# Patient Record
Sex: Female | Born: 1965 | Race: Black or African American | Hispanic: No | Marital: Married | Smoking: Never smoker
Health system: Southern US, Community
[De-identification: ages and names within clinical notes are randomized; demographics above are authoritative.]

## PROBLEM LIST (undated history)

## (undated) DIAGNOSIS — E119 Type 2 diabetes mellitus without complications: Secondary | ICD-10-CM

## (undated) DIAGNOSIS — F419 Anxiety disorder, unspecified: Secondary | ICD-10-CM

## (undated) DIAGNOSIS — K219 Gastro-esophageal reflux disease without esophagitis: Secondary | ICD-10-CM

## (undated) DIAGNOSIS — I10 Essential (primary) hypertension: Secondary | ICD-10-CM

## (undated) DIAGNOSIS — E785 Hyperlipidemia, unspecified: Secondary | ICD-10-CM

## (undated) HISTORY — DX: Gastro-esophageal reflux disease without esophagitis: K21.9

## (undated) HISTORY — DX: Anxiety disorder, unspecified: F41.9

## (undated) HISTORY — DX: Hyperlipidemia, unspecified: E78.5

---

## 2002-01-20 ENCOUNTER — Inpatient Hospital Stay (HOSPITAL_COMMUNITY): Admission: AD | Admit: 2002-01-20 | Discharge: 2002-01-20 | Payer: Self-pay | Admitting: Obstetrics and Gynecology

## 2003-10-05 ENCOUNTER — Emergency Department (HOSPITAL_COMMUNITY): Admission: EM | Admit: 2003-10-05 | Discharge: 2003-10-05 | Payer: Self-pay | Admitting: Emergency Medicine

## 2004-02-13 ENCOUNTER — Other Ambulatory Visit: Admission: RE | Admit: 2004-02-13 | Discharge: 2004-02-13 | Payer: Self-pay | Admitting: *Deleted

## 2005-04-11 ENCOUNTER — Emergency Department (HOSPITAL_COMMUNITY): Admission: EM | Admit: 2005-04-11 | Discharge: 2005-04-11 | Payer: Self-pay | Admitting: Emergency Medicine

## 2006-09-07 ENCOUNTER — Emergency Department (HOSPITAL_COMMUNITY): Admission: EM | Admit: 2006-09-07 | Discharge: 2006-09-07 | Payer: Self-pay | Admitting: Emergency Medicine

## 2007-02-03 ENCOUNTER — Other Ambulatory Visit: Admission: RE | Admit: 2007-02-03 | Discharge: 2007-02-03 | Payer: Self-pay | Admitting: Family Medicine

## 2007-06-23 ENCOUNTER — Encounter (INDEPENDENT_AMBULATORY_CARE_PROVIDER_SITE_OTHER): Payer: Self-pay | Admitting: Obstetrics and Gynecology

## 2007-06-23 ENCOUNTER — Ambulatory Visit (HOSPITAL_COMMUNITY): Admission: RE | Admit: 2007-06-23 | Discharge: 2007-06-23 | Payer: Self-pay | Admitting: Obstetrics and Gynecology

## 2007-12-21 IMAGING — CT CT PELVIS W/ CM
2 of 5 series · 17 of 46 positions shown, 19 images · IV contrast ([ID]/WATER & 100 ML OMNI 300)
Comparison: 10/05/03.

CLINICAL DATA: 40-year-old with left-sided abdominal pain.
 ABDOMEN CT WITH CONTRAST:
TECHNIQUE: Multidetector CT imaging of the abdomen was performed following the standard protocol during bolus administration of intravenous contrast.
 Contrast:  100 cc Omnipaque 300.
TECHNIQUE: Multidetector CT imaging of the pelvis was performed following the standard protocol during bolus administration of intravenous contrast.

[Series 2: routine abdomen · axial · 0.94mm/px · z∈[-443,-33]mm · 14 of 92 slices shown, 16 images]
[im 5/92  soft-tissue]
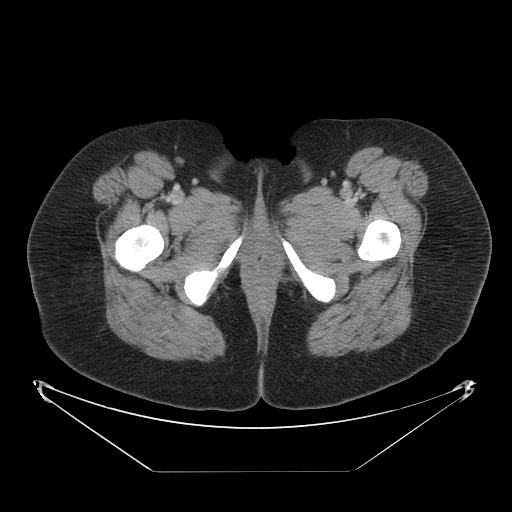
[im 5/92  bone]
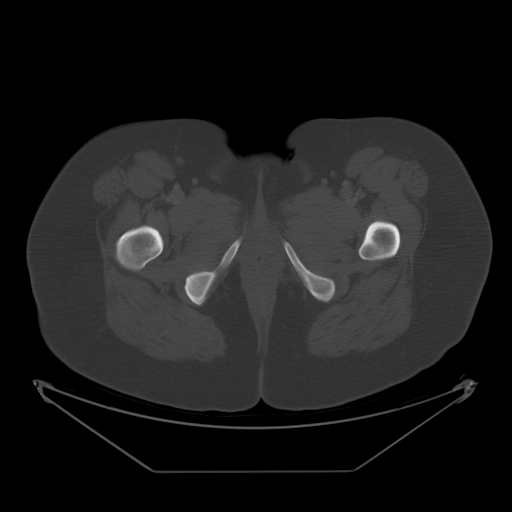
[im 10/92  soft-tissue]
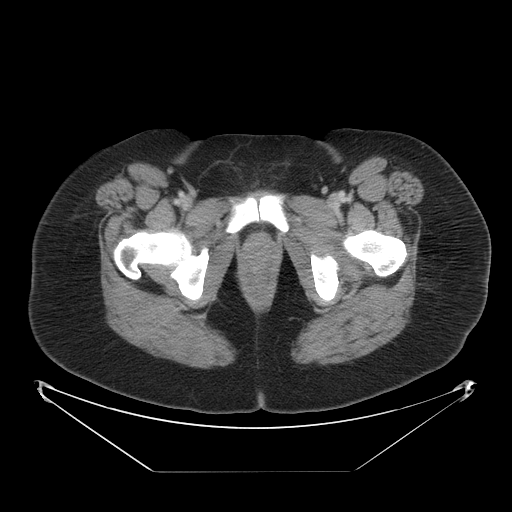
[im 20/92  soft-tissue]
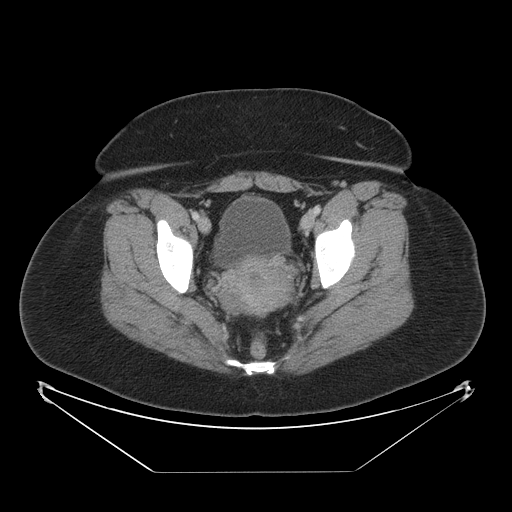
[im 24/92  soft-tissue]
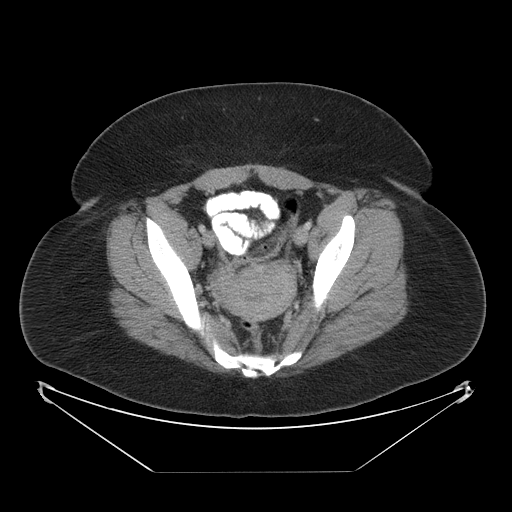
[im 29/92  soft-tissue]
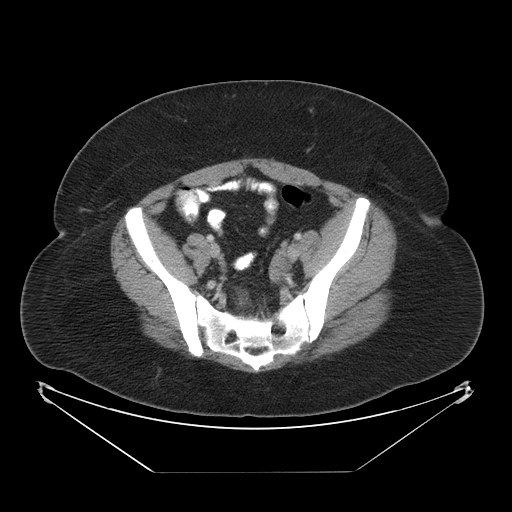
[im 39/92  soft-tissue]
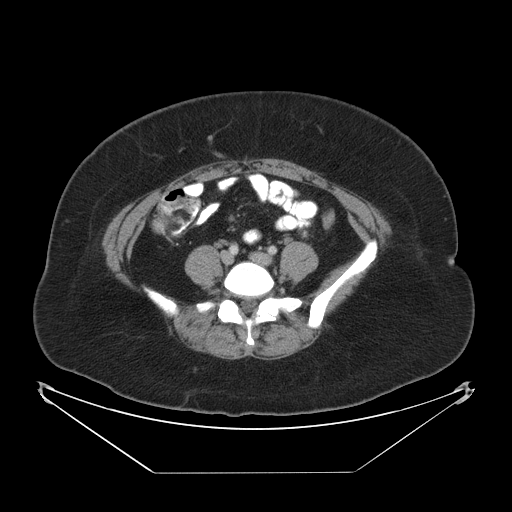
[im 44/92  soft-tissue]
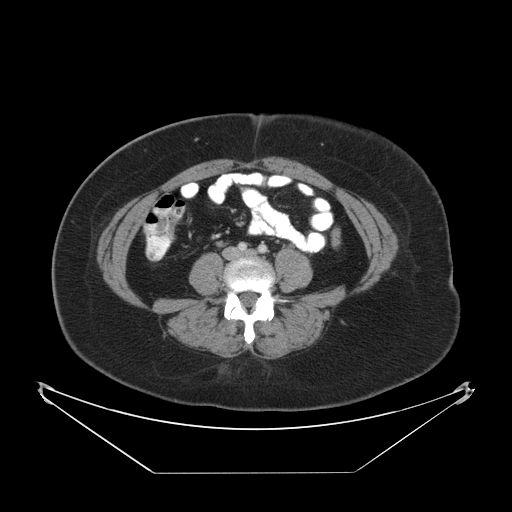
[im 48/92  soft-tissue]
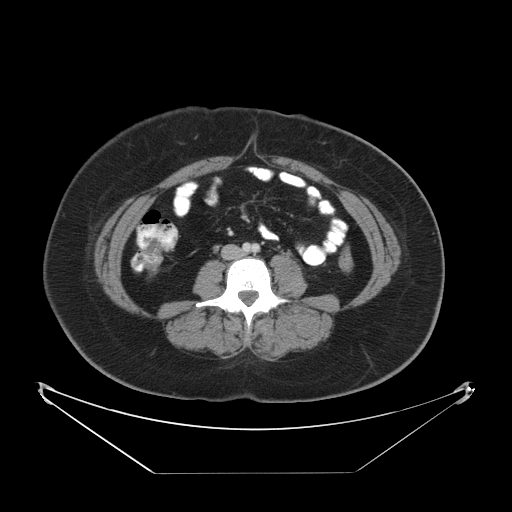
[im 53/92  soft-tissue]
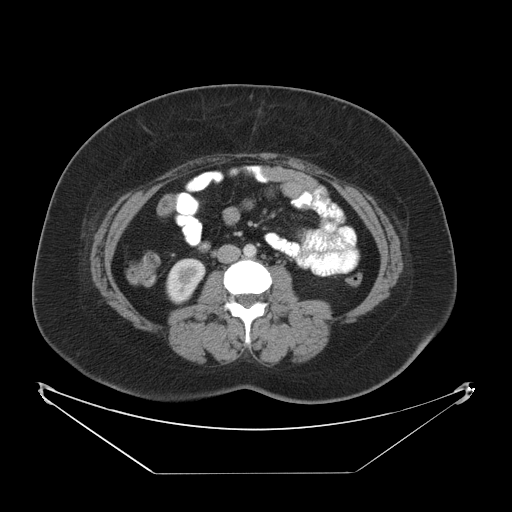
[im 53/92  bone]
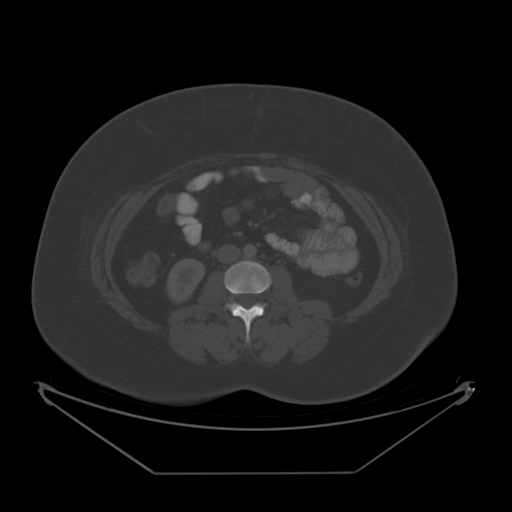
[im 63/92  soft-tissue]
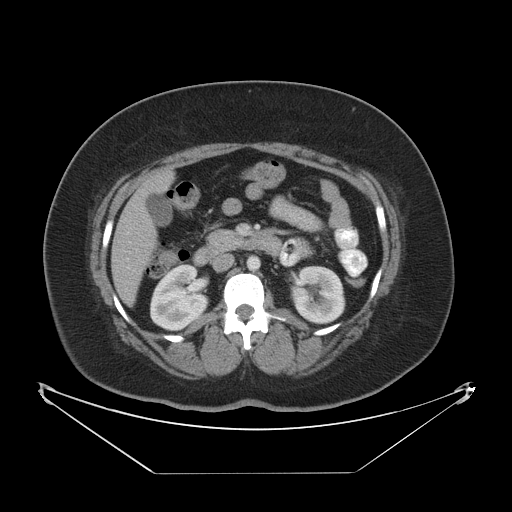
[im 68/92  soft-tissue]
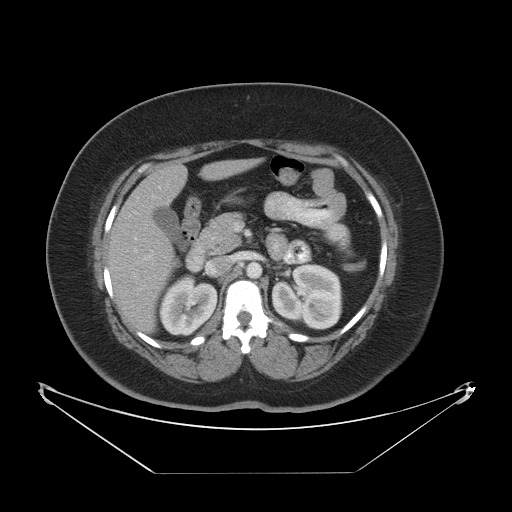
[im 72/92  soft-tissue]
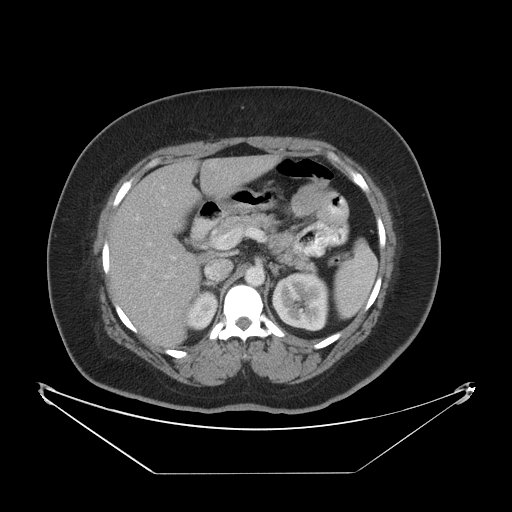
[im 82/92  soft-tissue]
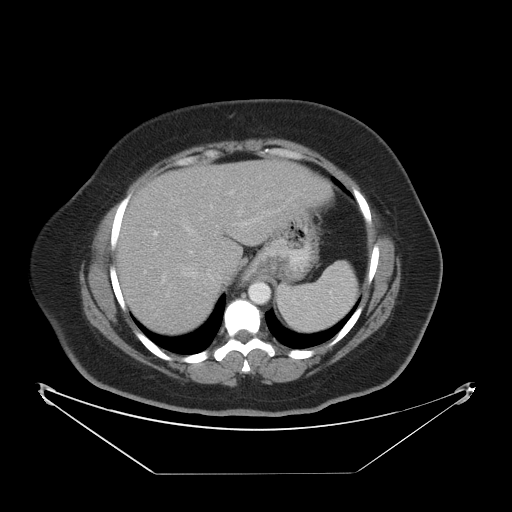
[im 87/92  soft-tissue]
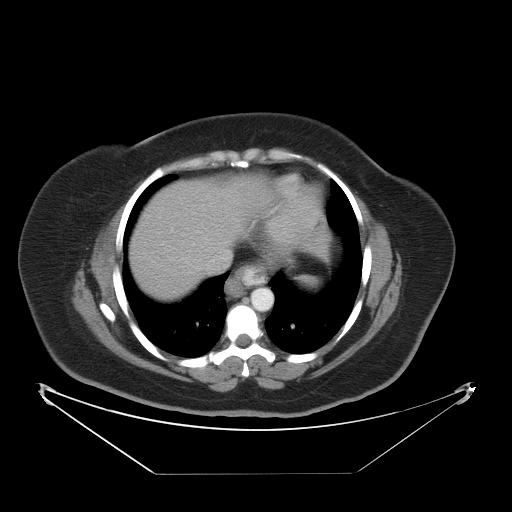

[Series 400: coronals · coronal · 0.94mm/px · 3 of 77 slices shown]
[im 26/77  soft-tissue]
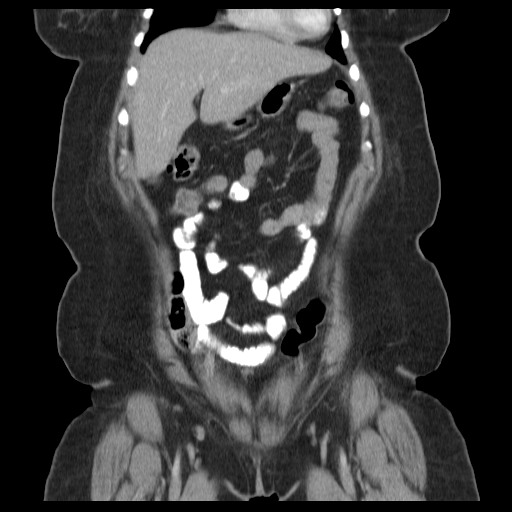
[im 34/77  soft-tissue]
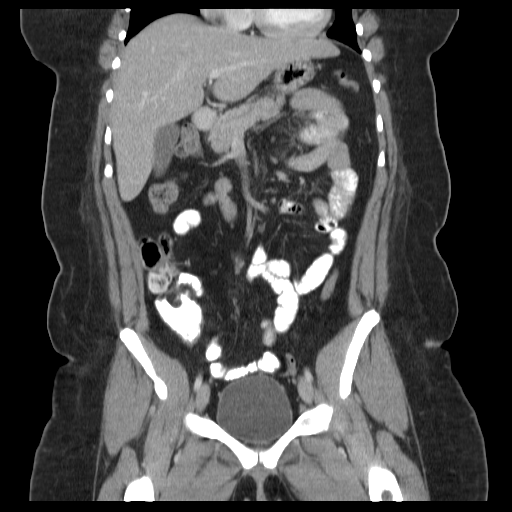
[im 43/77  soft-tissue]
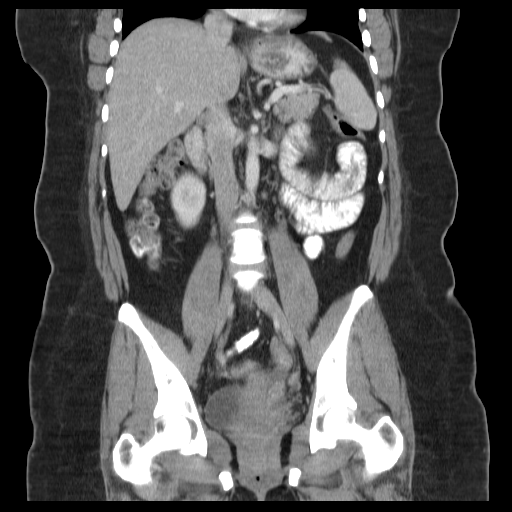

[17 of 46 positions shown; findings below may reference images not displayed]

FINDINGS: The lung bases are clear.  The patient has a moderate sized hiatal hernia.  Heart size is normal.  The distal thoracic aorta is normal in caliber.   
 The liver, spleen, pancreas, adrenal glands and kidneys are unremarkable and stable in appearance.  
 The stomach, duodenum, small bowel and colon demonstrate no significant abnormalities.  The aorta is normal in caliber.  No dissection.  Branch vessels are normal.  Portal and splenic veins are patent.  Gallbladder appears normal.  No significant bony findings.
IMPRESSION: 1. Moderate sized hiatal hernia.  
 2. No acute abdominal findings, mass lesions or adenopathy.  
 PELVIS CT WITH CONTRAST:
FINDINGS: The uterus and ovaries appear normal.  Rectum, sigmoid colon and visualized small bowel loops are unremarkable.  No pelvic masses or adenopathy.  Major vascular structures appear normal.  No significant bony findings.
IMPRESSION: No acute pelvic findings.

## 2008-11-04 ENCOUNTER — Other Ambulatory Visit: Admission: RE | Admit: 2008-11-04 | Discharge: 2008-11-04 | Payer: Self-pay | Admitting: Family Medicine

## 2008-11-08 ENCOUNTER — Ambulatory Visit (HOSPITAL_COMMUNITY): Admission: RE | Admit: 2008-11-08 | Discharge: 2008-11-08 | Payer: Self-pay | Admitting: Family Medicine

## 2008-11-11 ENCOUNTER — Encounter: Admission: RE | Admit: 2008-11-11 | Discharge: 2008-11-11 | Payer: Self-pay | Admitting: Family Medicine

## 2010-10-21 ENCOUNTER — Encounter: Payer: Self-pay | Admitting: Family Medicine

## 2011-02-12 NOTE — Op Note (Signed)
NAME:  Krystal Mcclain, Krystal Mcclain NO.:  [id number]   MEDICAL RECORD NO.:  [id number]          PATIENT TYPE:  AMB   LOCATION:  SDC                           FACILITY:  [facility]   PHYSICIAN:  Charles A. Delcambre, MDDATE OF BIRTH:  August 15, 1966   DATE OF PROCEDURE:  06/23/2007  DATE OF DISCHARGE:                               OPERATIVE REPORT   PREOPERATIVE DIAGNOSES:  1. Menorrhagia.  2. Possible endometrial polyps.   POSTOPERATIVE DIAGNOSES:  1. Menorrhagia.  2. Possible endometrial polyps.   PROCEDURE:  1. Hysteroscopy.  2. Dilatation and curettage.  3. ThermaChoice endometrial ablation.  4. Paracervical block.   SURGEON:  Charles A. Sydnee Cabal, M.D.   ASSISTANT:  None.   COMPLICATIONS:  None.   ESTIMATED BLOOD LOSS:  Less than 10 cc.   SPECIMENS:  Endometrial polyps, endometrial curettings to pathology.   OPERATIVE FINDINGS:  Multiple endometrial polyps, approximately 1 cm,  sounded to 10 cm.   SPONGE, NEEDLE, INSTRUMENT COUNTS:  Correct x2.   Sorbitol wash 30 cc.   DESCRIPTION OF PROCEDURE:  Patient was taken to the operating room,  placed in a supine position.  A general anesthetic was induced without  difficulty.  She was then placed in the dorsal lithotomy position in the  universal stirrups.  Sterilely prepped and draped was undertaken.  A  weighted speculum was placed in the vagina.  The tenaculum was placed on  the anterior lip of the cervix.  Sound was passed to 10 cm.  There was  no obvious perforation.  A very slight dilation with Hanks dilators was  used, 3 mm.  A hysteroscope was then passed with findings as noted  above.  With polyp forceps, multiple polyps were removed.  A small banjo  curette would fit through the cervix at that point, and circumferential  curettage was undertaken.  There was no evidence of perforation.  A  moderate amount of tissue was obtained.  Attention was then given to the  ThermaChoice.  The ThermaChoice was prepped per  protocol and brought to  -2 180 mmHg and then inserted to the fundus and then withdrawn  approximately 1 cm.  Pressure was stabilized with injecting water to D5W  to 160 to 180 mmHg.  Once stabilization occurring, the heating cycle was  begun.  The heating cycle took just over a minute, then the therapeutic  cycle at 87 degree Centigrade was accomplished without difficulty,  incident, or complications, for 8 minutes.  Cool-down was allowed, and  fluid was removed from the balloon.  A total of 20 cc had been injected  to distend the balloon in the cavity.  There was no evidence of  perforation during the case.  The tenaculum was removed.  Hemostasis was  adequate.  Patient tolerated the procedure well and was taken to the  recovery room with the assistant in attendance.      Charles A. Sydnee Cabal, MD  Electronically Signed     CAD/MEDQ  D:  06/23/2007  T:  06/23/2007  Job:  [id number]

## 2011-07-10 ENCOUNTER — Other Ambulatory Visit: Payer: Self-pay | Admitting: Physician Assistant

## 2011-07-10 ENCOUNTER — Other Ambulatory Visit (HOSPITAL_COMMUNITY)
Admission: RE | Admit: 2011-07-10 | Discharge: 2011-07-10 | Disposition: A | Discharge status: Home / Self Care | Payer: BC Managed Care – PPO | Source: Ambulatory Visit | Attending: Family Medicine | Admitting: Family Medicine

## 2011-07-10 DIAGNOSIS — Z124 Encounter for screening for malignant neoplasm of cervix: Secondary | ICD-10-CM | POA: Insufficient documentation

## 2011-07-11 LAB — CBC
HCT: 33.5 — ABNORMAL LOW
Hemoglobin: 10.8 — ABNORMAL LOW
MCHC: 32.3
MCV: 65.9 — ABNORMAL LOW
Platelets: 314
RBC: 5.09
RDW: 25.2 — ABNORMAL HIGH
WBC: 7.7

## 2011-07-11 LAB — PREGNANCY, URINE: Preg Test, Ur: NEGATIVE

## 2011-07-24 ENCOUNTER — Other Ambulatory Visit (HOSPITAL_COMMUNITY): Payer: Self-pay | Admitting: Physician Assistant

## 2011-07-24 DIAGNOSIS — Z1231 Encounter for screening mammogram for malignant neoplasm of breast: Secondary | ICD-10-CM

## 2011-08-16 ENCOUNTER — Ambulatory Visit (HOSPITAL_COMMUNITY): Payer: BC Managed Care – PPO | Attending: Physician Assistant

## 2013-07-09 ENCOUNTER — Ambulatory Visit
Admission: RE | Admit: 2013-07-09 | Discharge: 2013-07-09 | Disposition: A | Discharge status: Home / Self Care | Payer: 59 | Source: Ambulatory Visit | Attending: Physician Assistant | Admitting: Physician Assistant

## 2013-07-09 ENCOUNTER — Other Ambulatory Visit: Payer: Self-pay | Admitting: Physician Assistant

## 2013-07-09 DIAGNOSIS — M79672 Pain in left foot: Secondary | ICD-10-CM

## 2013-09-27 ENCOUNTER — Emergency Department (HOSPITAL_COMMUNITY): Payer: Managed Care, Other (non HMO)

## 2013-09-27 ENCOUNTER — Encounter (HOSPITAL_COMMUNITY): Payer: Self-pay | Admitting: Emergency Medicine

## 2013-09-27 ENCOUNTER — Emergency Department (HOSPITAL_COMMUNITY)
Admission: EM | Admit: 2013-09-27 | Discharge: 2013-09-27 | Disposition: A | Discharge status: Home / Self Care | Payer: Managed Care, Other (non HMO) | Attending: Emergency Medicine | Admitting: Emergency Medicine

## 2013-09-27 DIAGNOSIS — I10 Essential (primary) hypertension: Secondary | ICD-10-CM | POA: Insufficient documentation

## 2013-09-27 DIAGNOSIS — Z791 Long term (current) use of non-steroidal anti-inflammatories (NSAID): Secondary | ICD-10-CM | POA: Insufficient documentation

## 2013-09-27 DIAGNOSIS — S335XXA Sprain of ligaments of lumbar spine, initial encounter: Secondary | ICD-10-CM | POA: Insufficient documentation

## 2013-09-27 DIAGNOSIS — Y92410 Unspecified street and highway as the place of occurrence of the external cause: Secondary | ICD-10-CM | POA: Insufficient documentation

## 2013-09-27 DIAGNOSIS — E119 Type 2 diabetes mellitus without complications: Secondary | ICD-10-CM | POA: Insufficient documentation

## 2013-09-27 DIAGNOSIS — S39012A Strain of muscle, fascia and tendon of lower back, initial encounter: Secondary | ICD-10-CM

## 2013-09-27 DIAGNOSIS — R42 Dizziness and giddiness: Secondary | ICD-10-CM | POA: Insufficient documentation

## 2013-09-27 DIAGNOSIS — Z79899 Other long term (current) drug therapy: Secondary | ICD-10-CM | POA: Insufficient documentation

## 2013-09-27 DIAGNOSIS — Y9389 Activity, other specified: Secondary | ICD-10-CM | POA: Insufficient documentation

## 2013-09-27 HISTORY — DX: Essential (primary) hypertension: I10

## 2013-09-27 HISTORY — DX: Type 2 diabetes mellitus without complications: E11.9

## 2013-09-27 MED ORDER — NAPROXEN 375 MG PO TABS: 375.0000 mg | ORAL_TABLET | Freq: Two times a day (BID) | ORAL | Status: DC

## 2013-09-27 MED ORDER — KETOROLAC TROMETHAMINE 60 MG/2ML IM SOLN
60.0000 mg | Freq: Once | INTRAMUSCULAR | Status: AC
  Administered 2013-09-27: 60 mg via INTRAMUSCULAR
  Filled 2013-09-27: qty 2

## 2013-09-27 MED ORDER — METHOCARBAMOL 500 MG PO TABS: 500.0000 mg | ORAL_TABLET | Freq: Two times a day (BID) | ORAL | Status: DC
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
## 2013-09-29 NOTE — ED Provider Notes (Signed)
Medical screening examination/treatment/procedure(s) were performed by non-physician practitioner and as supervising physician I was immediately available for consultation/collaboration.  EKG Interpretation   None        Doug Sou, MD 09/29/13 2036

## 2014-10-06 ENCOUNTER — Ambulatory Visit
Admission: RE | Admit: 2014-10-06 | Discharge: 2014-10-06 | Disposition: A | Discharge status: Home / Self Care | Payer: Managed Care, Other (non HMO) | Source: Ambulatory Visit | Attending: Physician Assistant | Admitting: Physician Assistant

## 2014-10-06 ENCOUNTER — Other Ambulatory Visit: Payer: Self-pay | Admitting: Physician Assistant

## 2014-10-06 DIAGNOSIS — R52 Pain, unspecified: Secondary | ICD-10-CM

## 2014-10-22 IMAGING — CR DG FOOT COMPLETE 3+V*L*
3 series · 3 of 3 positions shown · non-contrast
Comparison: Ankle films same date

CLINICAL DATA: Pain without trauma.

EXAM:
LEFT FOOT - COMPLETE 3+ VIEW

[t foot ap left]
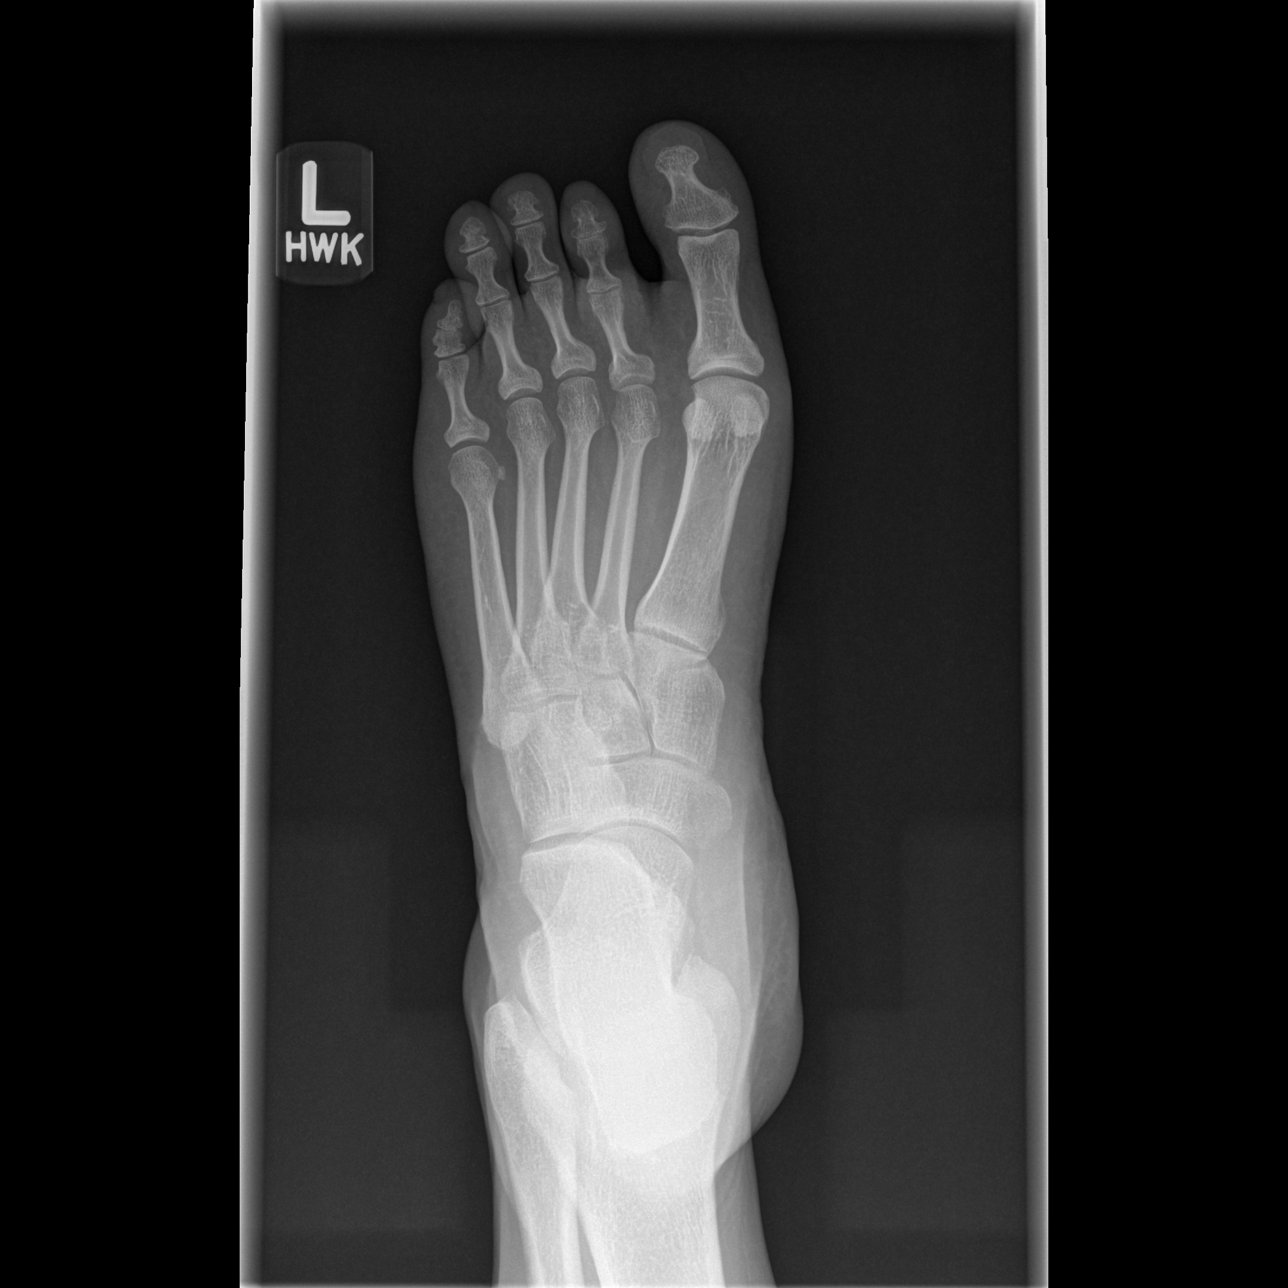

[t foot oblique left]
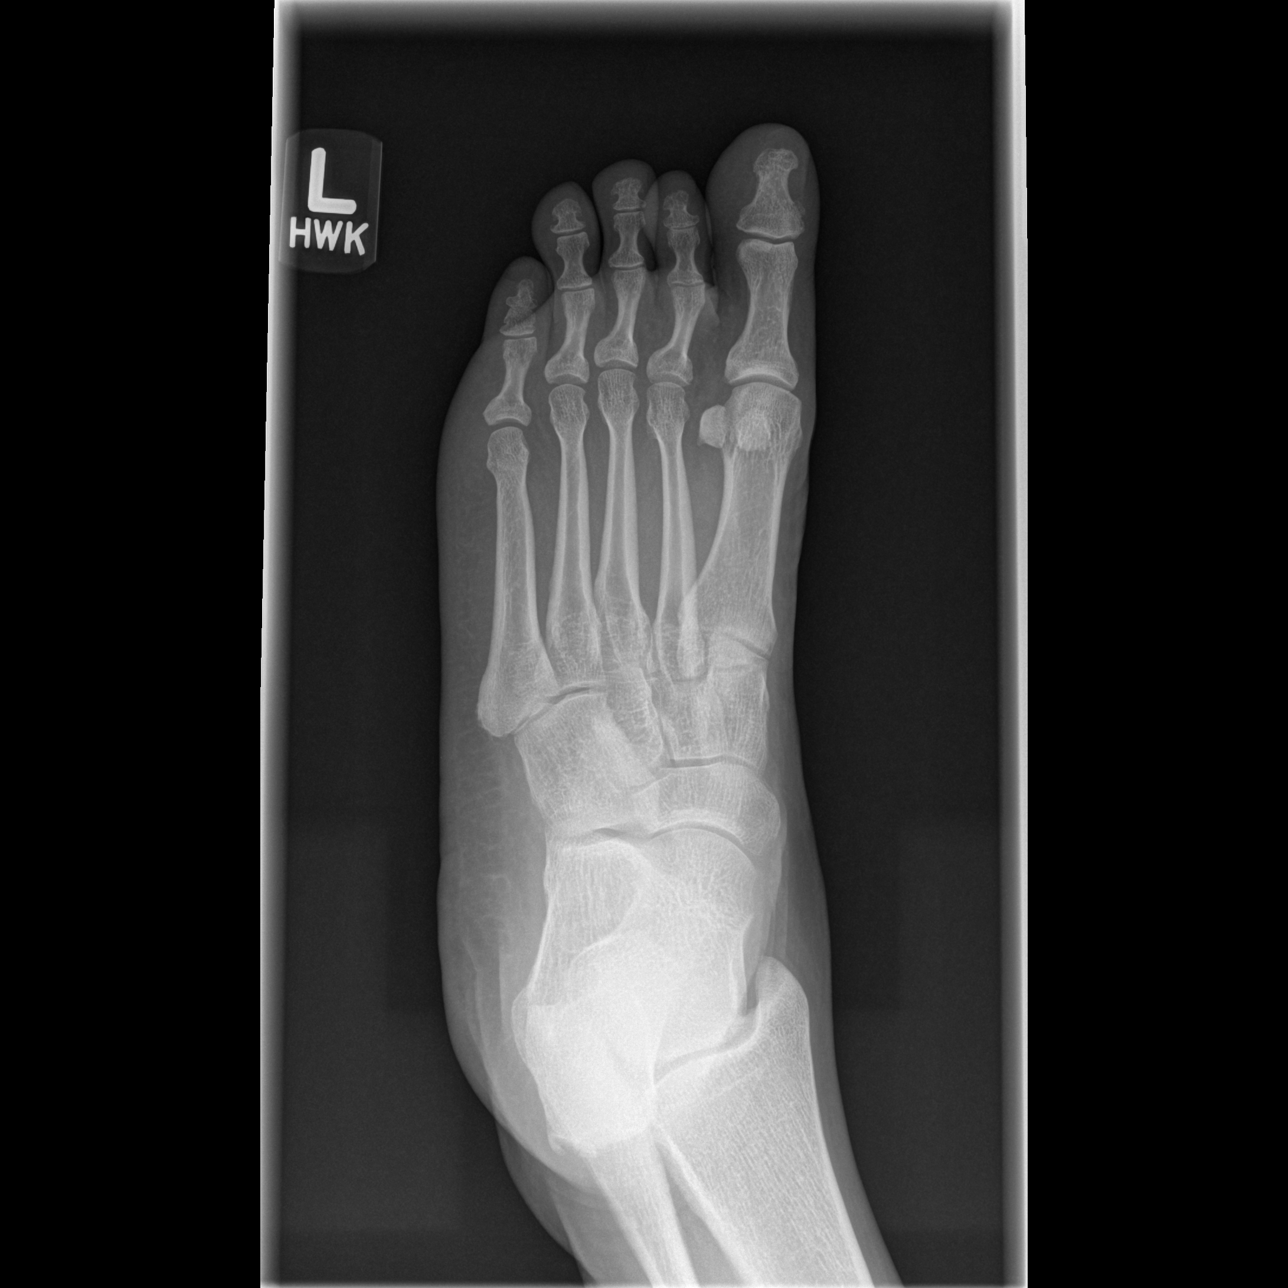

[t foot lat left]
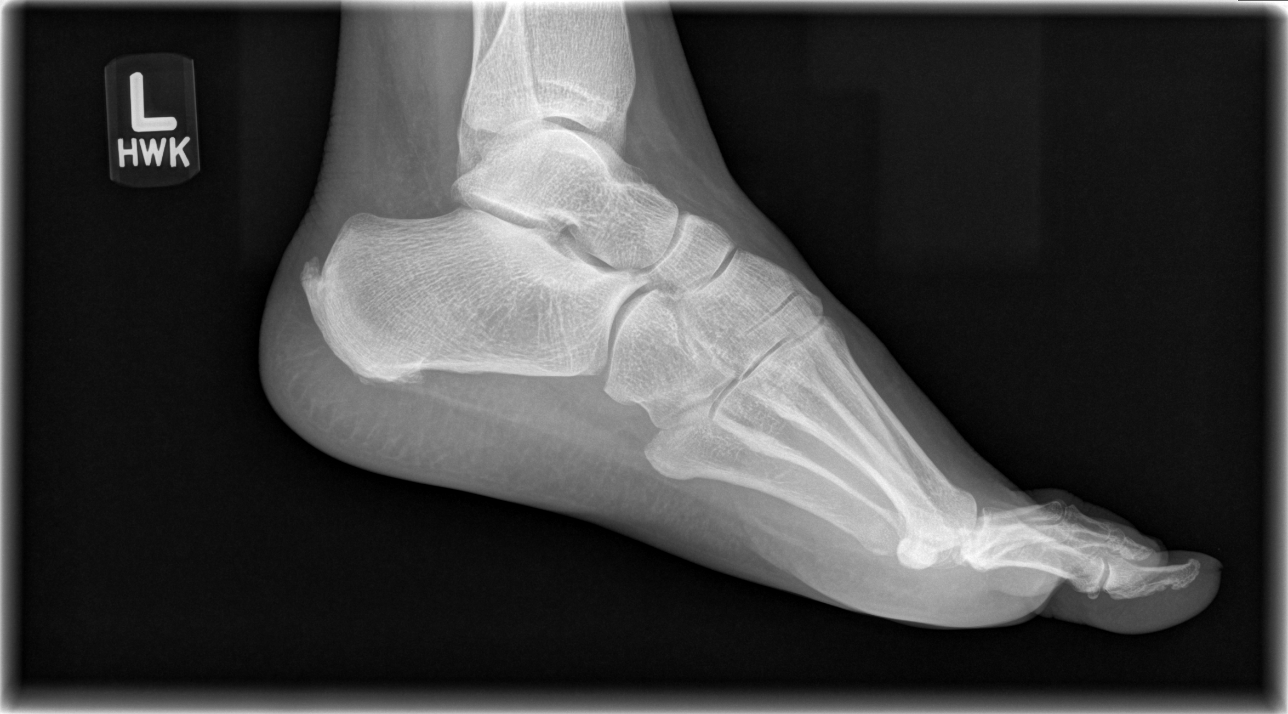

[3 of 3 positions shown; findings below may reference images not displayed]

FINDINGS: Relatively foreshortened 2nd digit, likely developmental variant.
Small Achilles and calcaneal spurs. No acute fracture or
dislocation. No evidence of stress fracture.
IMPRESSION: No acute osseous abnormality.

## 2015-12-11 ENCOUNTER — Other Ambulatory Visit: Payer: Self-pay | Admitting: Physician Assistant

## 2015-12-11 DIAGNOSIS — Z1231 Encounter for screening mammogram for malignant neoplasm of breast: Secondary | ICD-10-CM

## 2015-12-28 ENCOUNTER — Ambulatory Visit
Admission: RE | Admit: 2015-12-28 | Discharge: 2015-12-28 | Disposition: A | Discharge status: Home / Self Care | Payer: Managed Care, Other (non HMO) | Source: Ambulatory Visit | Attending: Physician Assistant | Admitting: Physician Assistant

## 2015-12-28 DIAGNOSIS — Z1231 Encounter for screening mammogram for malignant neoplasm of breast: Secondary | ICD-10-CM

## 2016-01-02 ENCOUNTER — Other Ambulatory Visit: Payer: Self-pay | Admitting: Physician Assistant

## 2016-01-02 DIAGNOSIS — R928 Other abnormal and inconclusive findings on diagnostic imaging of breast: Secondary | ICD-10-CM

## 2016-01-09 ENCOUNTER — Ambulatory Visit
Admission: RE | Admit: 2016-01-09 | Discharge: 2016-01-09 | Disposition: A | Discharge status: Home / Self Care | Payer: Managed Care, Other (non HMO) | Source: Ambulatory Visit | Attending: Physician Assistant | Admitting: Physician Assistant

## 2016-01-09 DIAGNOSIS — R928 Other abnormal and inconclusive findings on diagnostic imaging of breast: Secondary | ICD-10-CM

## 2016-01-22 ENCOUNTER — Other Ambulatory Visit: Payer: Self-pay | Admitting: Physician Assistant

## 2016-01-22 DIAGNOSIS — R1310 Dysphagia, unspecified: Secondary | ICD-10-CM

## 2016-01-30 ENCOUNTER — Ambulatory Visit
Admission: RE | Admit: 2016-01-30 | Discharge: 2016-01-30 | Disposition: A | Discharge status: Home / Self Care | Payer: Managed Care, Other (non HMO) | Source: Ambulatory Visit | Attending: Physician Assistant | Admitting: Physician Assistant

## 2016-01-30 DIAGNOSIS — R1310 Dysphagia, unspecified: Secondary | ICD-10-CM

## 2016-08-06 ENCOUNTER — Other Ambulatory Visit: Payer: Self-pay | Admitting: Physician Assistant

## 2016-08-06 ENCOUNTER — Other Ambulatory Visit (HOSPITAL_COMMUNITY)
Admission: RE | Admit: 2016-08-06 | Discharge: 2016-08-06 | Disposition: A | Discharge status: Home / Self Care | Payer: Managed Care, Other (non HMO) | Source: Ambulatory Visit | Attending: Family Medicine | Admitting: Family Medicine

## 2016-08-06 DIAGNOSIS — Z124 Encounter for screening for malignant neoplasm of cervix: Secondary | ICD-10-CM | POA: Insufficient documentation

## 2016-08-08 LAB — CYTOLOGY - PAP: Diagnosis: NEGATIVE

## 2018-03-02 ENCOUNTER — Ambulatory Visit: Payer: Managed Care, Other (non HMO) | Admitting: Registered"

## 2018-10-14 ENCOUNTER — Other Ambulatory Visit: Payer: Self-pay | Admitting: Physician Assistant

## 2018-10-14 ENCOUNTER — Other Ambulatory Visit (HOSPITAL_COMMUNITY)
Admission: RE | Admit: 2018-10-14 | Discharge: 2018-10-14 | Disposition: A | Discharge status: Home / Self Care | Payer: 59 | Source: Ambulatory Visit | Attending: Physician Assistant | Admitting: Physician Assistant

## 2018-10-14 DIAGNOSIS — Z01411 Encounter for gynecological examination (general) (routine) with abnormal findings: Secondary | ICD-10-CM | POA: Diagnosis present

## 2018-10-19 LAB — CYTOLOGY - PAP: Diagnosis: NEGATIVE

## 2018-10-21 ENCOUNTER — Other Ambulatory Visit: Payer: Self-pay | Admitting: Physician Assistant

## 2018-10-21 DIAGNOSIS — N6489 Other specified disorders of breast: Secondary | ICD-10-CM

## 2018-10-23 ENCOUNTER — Ambulatory Visit
Admission: RE | Admit: 2018-10-23 | Discharge: 2018-10-23 | Disposition: A | Discharge status: Home / Self Care | Payer: 59 | Source: Ambulatory Visit | Attending: Physician Assistant | Admitting: Physician Assistant

## 2018-10-23 ENCOUNTER — Ambulatory Visit: Admission: RE | Admit: 2018-10-23 | Payer: Managed Care, Other (non HMO) | Source: Ambulatory Visit

## 2018-10-23 DIAGNOSIS — N6489 Other specified disorders of breast: Secondary | ICD-10-CM

## 2019-11-26 ENCOUNTER — Other Ambulatory Visit: Payer: Self-pay | Admitting: Physician Assistant

## 2019-11-26 DIAGNOSIS — Z1231 Encounter for screening mammogram for malignant neoplasm of breast: Secondary | ICD-10-CM

## 2020-01-07 ENCOUNTER — Ambulatory Visit: Payer: 59

## 2020-01-14 ENCOUNTER — Ambulatory Visit
Admission: RE | Admit: 2020-01-14 | Discharge: 2020-01-14 | Disposition: A | Discharge status: Home / Self Care | Payer: 59 | Source: Ambulatory Visit | Attending: Physician Assistant | Admitting: Physician Assistant

## 2020-01-14 ENCOUNTER — Other Ambulatory Visit: Payer: Self-pay

## 2020-01-14 DIAGNOSIS — Z1231 Encounter for screening mammogram for malignant neoplasm of breast: Secondary | ICD-10-CM

## 2020-01-17 ENCOUNTER — Other Ambulatory Visit: Payer: Self-pay | Admitting: Physician Assistant

## 2020-01-17 DIAGNOSIS — R928 Other abnormal and inconclusive findings on diagnostic imaging of breast: Secondary | ICD-10-CM

## 2020-01-28 ENCOUNTER — Other Ambulatory Visit: Payer: Self-pay

## 2020-01-28 ENCOUNTER — Ambulatory Visit
Admission: RE | Admit: 2020-01-28 | Discharge: 2020-01-28 | Disposition: A | Discharge status: Home / Self Care | Payer: 59 | Source: Ambulatory Visit | Attending: Physician Assistant | Admitting: Physician Assistant

## 2020-01-28 ENCOUNTER — Ambulatory Visit: Payer: 59

## 2020-01-28 DIAGNOSIS — R928 Other abnormal and inconclusive findings on diagnostic imaging of breast: Secondary | ICD-10-CM

## 2021-01-26 ENCOUNTER — Other Ambulatory Visit: Payer: Self-pay

## 2021-01-26 ENCOUNTER — Encounter: Payer: Self-pay | Admitting: Cardiology

## 2021-01-26 ENCOUNTER — Ambulatory Visit: Payer: 59 | Admitting: Cardiology

## 2021-01-26 VITALS — BP 148/89 | HR 78 | Temp 97.4°F | Resp 16 | Ht 64.0 in | Wt 236.1 lb

## 2021-01-26 DIAGNOSIS — E66813 Obesity, class 3: Secondary | ICD-10-CM

## 2021-01-26 DIAGNOSIS — Z794 Long term (current) use of insulin: Secondary | ICD-10-CM

## 2021-01-26 DIAGNOSIS — R0789 Other chest pain: Secondary | ICD-10-CM

## 2021-01-26 DIAGNOSIS — I10 Essential (primary) hypertension: Secondary | ICD-10-CM

## 2021-01-26 DIAGNOSIS — E119 Type 2 diabetes mellitus without complications: Secondary | ICD-10-CM

## 2021-01-26 DIAGNOSIS — E7800 Pure hypercholesterolemia, unspecified: Secondary | ICD-10-CM

## 2021-01-26 MED ORDER — VALSARTAN-HYDROCHLOROTHIAZIDE 160-25 MG PO TABS: 1.0000 | ORAL_TABLET | ORAL | 1 refills | Status: DC

## 2021-01-26 MED ORDER — METOPROLOL SUCCINATE ER 25 MG PO TB24: 25.0000 mg | ORAL_TABLET | Freq: Every evening | ORAL | 2 refills | Status: DC

## 2021-01-26 NOTE — Patient Instructions (Signed)
To be scheduled for a treadmill stress test in office soon.  Will also be scheduled for an echocardiogram which is the ultrasound for your heart to evaluate your heart function.  You need to get blood work done in 2 weeks at [facility], it does not have to be fasting.  You will take a total of 160 mg of valsartan, you have 40 mg tablets, hence take 4 tablets every day and also take hydrochlorothiazide 25 mg with this in the morning.  Once you are finished with valsartan tablets that are at home, discard both hydrochlorothiazide and the remaining valsartan and pick up a new prescription for new dose that is 160 mg / 25 mg of valsartan HCT.    I will see you back after the test.

## 2021-01-26 NOTE — Progress Notes (Signed)
Primary Physician/Referring:  Milus Height, PA  Patient ID: Krystal Mcclain, female    DOB: 1966/06/22, 55 y.o.   MRN: [id number]  Chief Complaint  Patient presents with  . Chest Pain  . New Patient (Initial Visit)    Referred by Ardean Larsen, MD   HPI:    Krystal Mcclain  is a 55 y.o. African-American female patient with hypertension, hyperlipidemia, diabetes mellitus, sickle cell trait, referred to me for evaluation of chest pain.  She is markedly sedentary, has been doing well until about 3 weeks ago had sudden onset of chest tightness while at work that was on and off that lasted for 3 days.  Since then she has not had any recurrence.  She has been taking it easy since then.  No other associated symptoms of nausea, vomiting or diaphoresis.  Denies any dyspnea but states that her activity is markedly limited.  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  After she was seen by her PCP, she was restarted back on atorvastatin which she had discontinued 4 to 6 months ago.  She is tolerating all her medications well.  Past Medical History:  Diagnosis Date  . Anxiety   . Diabetes mellitus without complication (HCC)   . Hypertension   . Mild acid reflux    Past Surgical History:  Procedure Laterality Date  . CESAREAN SECTION  11/1990   Family History  Problem Relation Age of Onset  . Heart attack Mother 60  . Other Father 35       Drowned  . Other Brother 82       Pacemaker    Social History   Tobacco Use  . Smoking status: Never Smoker  . Smokeless tobacco: Never Used  Substance Use Topics  . Alcohol use: Never   Marital Status: Married  ROS  Review of Systems  Cardiovascular: Positive for chest pain. Negative for dyspnea on exertion and leg swelling.  Gastrointestinal: Negative for melena.   Objective  Blood pressure (!) 148/89, pulse 78, temperature (!) 97.4 F (36.3 C), temperature source Temporal, resp. rate 16, height 5\' 4"  (1.626 m), weight 236 lb 1.6 oz  (107.1 kg), SpO2 98 %.   Vitals with BMI 01/26/2021 01/26/2021 09/27/2013  Height - 5\' 4"  5\' 4"   Weight - 236 lbs 2 oz 240 lbs  BMI - 40.51 41.3  Systolic 148 176 09/29/2013  Diastolic 89 87 87  Pulse 78 87 83     Physical Exam  Constitutional: No distress.  Morbidly obese in no acute distress  Eyes: Conjunctivae are normal.  Neck: No JVD present.  Cardiovascular: Normal rate, regular rhythm, normal heart sounds, intact distal pulses and normal pulses. Exam reveals no gallop.  No murmur heard. Pulmonary/Chest: Effort normal and breath sounds normal. She exhibits no tenderness.  Abdominal: Soft. Bowel sounds are normal.  Large pannus present  Musculoskeletal:        General: No edema. Normal range of motion.     Cervical back: Neck supple.  Neurological: She is alert and oriented to person, place, and time.  Skin: Skin is warm.     Laboratory examination:   External labs:   Labs 12/07/2020:  Total cholesterol 261, triglycerides 102, HDL 52, LDL 192.  Non-HDL cholesterol 210.  A1c 6.8%.  Labs 02/03/2020:  Total cholesterol 161, triglycerides 105, HDL 44, LDL 103.  Serum glucose 253 mg, BUN 10, creatinine 0.63, potassium 4.3, CMP otherwise normal.  Medications and allergies  No Known Allergies   Current Outpatient Medications on File Prior to Visit  Medication Sig Dispense Refill  . aspirin EC 81 MG tablet Take 81 mg by mouth daily. Swallow whole.    Marland Kitchen atorvastatin (LIPITOR) 40 MG tablet Take 60 mg by mouth daily. 1 1/2 tablets = 60 mg daily    . busPIRone (BUSPAR) 5 MG tablet Take 5 mg by mouth 2 (two) times daily as needed.    . Empagliflozin-metFORMIN HCl (SYNJARDY) 12.01-999 MG TABS Take 1 tablet by mouth in the morning and at bedtime.    Marland Kitchen esomeprazole (NEXIUM) 40 MG capsule Take 40 mg by mouth daily as needed.    Marland Kitchen ibuprofen (ADVIL,MOTRIN) 200 MG tablet Take 400 mg by mouth every 6 (six) hours as needed for fever or moderate pain.    Marland Kitchen LEVEMIR FLEXTOUCH 100 UNIT/ML  FlexPen Inject 10 Units into the skin at bedtime.    . nitroGLYCERIN (NITROSTAT) 0.3 MG SL tablet Place under the tongue.    . TRULICITY 0.75 MG/0.5ML SOPN 30 mLs.     No current facility-administered medications on file prior to visit.     Radiology:   No results found.  Cardiac Studies:   None  EKG:     EKG 01/26/2021: Normal sinus rhythm at rate of 77 bpm, normal axis.  No evidence of ischemia, normal EKG.      Assessment     ICD-10-CM   1. Atypical chest pain  R07.89 EKG 12-Lead    PCV MYOCARDIAL PERFUSION WO LEXISCAN    PCV ECHOCARDIOGRAM COMPLETE    metoprolol succinate (TOPROL-XL) 25 MG 24 hr tablet  2. Type 2 diabetes mellitus without complication, with long-term current use of insulin (HCC)  E11.9 PCV MYOCARDIAL PERFUSION WO LEXISCAN   Z79.4 PCV ECHOCARDIOGRAM COMPLETE  3. Primary hypertension  I10 valsartan-hydrochlorothiazide (DIOVAN HCT) 160-25 MG tablet    Basic metabolic panel    Basic metabolic panel  4. Hypercholesteremia  E78.00   5. Class 3 severe obesity due to excess calories with serious comorbidity and body mass index (BMI) of 40.0 to 44.9 in adult [facility])  E66.01    Z68.41     Patient's cardiovascular risk history includes: LDL goal is under 80 [place] 10-year risk >20%   Orders Placed This Encounter  Procedures  . Basic metabolic panel    Standing Status:   Future    Number of Occurrences:   1    Standing Expiration Date:   01/26/2022  . PCV MYOCARDIAL PERFUSION WO LEXISCAN    Standing Status:   Future    Standing Expiration Date:   03/28/2021  . EKG 12-Lead  . PCV ECHOCARDIOGRAM COMPLETE    Standing Status:   Future    Standing Expiration Date:   01/26/2022   Recommendations:   Krystal Mcclain is a 55 y.o. African-American female patient with hypertension, hyperlipidemia, diabetes mellitus, sickle cell trait, referred to me for evaluation of chest pain.  Patient symptoms occurred about 2 to 3 weeks ago, she had discontinued her statins  as well at that time.  She has now been started back on statin.  It appears that on the present dose, previously her LDL was much improved but still not at goal and I suspect she will probably need addition of Zetia as well.  Blood pressure is also not well controlled.  I have increased her valsartan dose from 40 mg to 160 mg and will combine HCTZ with this to make it valsartan HCT  160 mg / 25 mg in the morning.  We will obtain a BMP in 2 weeks.  Discussed regarding calorie restriction.  She is got morbid obesity with significant cardiovascular risk factors that includes diabetes mellitus, hypertension and hyperlipidemia and African-American heritage.  Schedule for a Exercise Nuclear stress test to evaluate for myocardial ischemia. Will schedule for an echocardiogram. Office visit following the work-up/investigations.  Her CV risk is >20%.    Yates Decamp, MD, [facility] 01/26/2021, 11:34 AM Office: [phone]

## 2021-01-29 ENCOUNTER — Ambulatory Visit: Payer: 59

## 2021-01-29 ENCOUNTER — Other Ambulatory Visit: Payer: Self-pay

## 2021-01-29 DIAGNOSIS — E119 Type 2 diabetes mellitus without complications: Secondary | ICD-10-CM

## 2021-01-29 DIAGNOSIS — Z794 Long term (current) use of insulin: Secondary | ICD-10-CM

## 2021-01-29 DIAGNOSIS — R0789 Other chest pain: Secondary | ICD-10-CM

## 2021-02-03 LAB — BASIC METABOLIC PANEL
BUN/Creatinine Ratio: 11 (ref 9–23)
BUN: 8 mg/dL (ref 6–24)
CO2: 22 mmol/L (ref 20–29)
Calcium: 9.3 mg/dL (ref 8.7–10.2)
Chloride: 104 mmol/L (ref 96–106)
Creatinine, Ser: 0.7 mg/dL (ref 0.57–1.00)
Glucose: 91 mg/dL (ref 65–99)
Potassium: 4.7 mmol/L (ref 3.5–5.2)
Sodium: 143 mmol/L (ref 134–144)
eGFR: 102 mL/min/{1.73_m2} (ref >59)

## 2021-02-05 ENCOUNTER — Other Ambulatory Visit: Payer: No Typology Code available for payment source

## 2021-02-28 ENCOUNTER — Other Ambulatory Visit: Payer: No Typology Code available for payment source

## 2021-03-12 ENCOUNTER — Other Ambulatory Visit: Payer: Self-pay | Admitting: Cardiology

## 2021-03-12 DIAGNOSIS — R0789 Other chest pain: Secondary | ICD-10-CM

## 2021-03-26 ENCOUNTER — Other Ambulatory Visit: Payer: Self-pay

## 2021-03-26 ENCOUNTER — Ambulatory Visit: Payer: No Typology Code available for payment source

## 2021-03-26 DIAGNOSIS — Z794 Long term (current) use of insulin: Secondary | ICD-10-CM

## 2021-03-26 DIAGNOSIS — R0789 Other chest pain: Secondary | ICD-10-CM

## 2021-03-30 NOTE — Progress Notes (Signed)
Exercise Tetrofosmin stress test 03/26/2021: Exercise nuclear stress test was performed using Bruce protocol. Patient reached 7 METS, and 88% of age predicted maximum heart rate. Exercise capacity was low. No chest pain reported. Heart rate and hemodynamic response were normal. Stress EKG revealed no ischemic changes. Normal myocardial perfusion. SPECT images show small sized, mild intensity, reversible perfusion defect in basal inferior myocardium. Stress LVEF 51%. Low risk study.

## 2021-07-02 ENCOUNTER — Other Ambulatory Visit: Payer: Self-pay | Admitting: Cardiology

## 2021-07-02 DIAGNOSIS — R0789 Other chest pain: Secondary | ICD-10-CM

## 2021-07-22 ENCOUNTER — Other Ambulatory Visit: Payer: Self-pay | Admitting: Cardiology

## 2021-07-22 DIAGNOSIS — I10 Essential (primary) hypertension: Secondary | ICD-10-CM

## 2021-09-04 ENCOUNTER — Other Ambulatory Visit: Payer: Self-pay | Admitting: Cardiology

## 2021-09-04 DIAGNOSIS — R0789 Other chest pain: Secondary | ICD-10-CM

## 2021-12-20 ENCOUNTER — Other Ambulatory Visit: Payer: Self-pay | Admitting: Cardiology

## 2021-12-20 DIAGNOSIS — R0789 Other chest pain: Secondary | ICD-10-CM

## 2022-01-16 ENCOUNTER — Other Ambulatory Visit: Payer: Self-pay | Admitting: Cardiology

## 2022-01-16 DIAGNOSIS — I10 Essential (primary) hypertension: Secondary | ICD-10-CM

## 2022-02-02 ENCOUNTER — Other Ambulatory Visit: Payer: Self-pay | Admitting: Cardiology

## 2022-02-02 DIAGNOSIS — R0789 Other chest pain: Secondary | ICD-10-CM

## 2022-03-11 ENCOUNTER — Other Ambulatory Visit: Payer: Self-pay | Admitting: Cardiology

## 2022-03-11 DIAGNOSIS — R0789 Other chest pain: Secondary | ICD-10-CM

## 2022-04-22 ENCOUNTER — Other Ambulatory Visit: Payer: Self-pay | Admitting: Cardiology

## 2022-04-22 DIAGNOSIS — I10 Essential (primary) hypertension: Secondary | ICD-10-CM

## 2022-04-22 DIAGNOSIS — R0789 Other chest pain: Secondary | ICD-10-CM

## 2022-10-09 ENCOUNTER — Other Ambulatory Visit: Payer: Self-pay | Admitting: Physician Assistant

## 2022-10-09 DIAGNOSIS — Z1231 Encounter for screening mammogram for malignant neoplasm of breast: Secondary | ICD-10-CM

## 2022-10-11 ENCOUNTER — Ambulatory Visit
Admission: RE | Admit: 2022-10-11 | Discharge: 2022-10-11 | Disposition: A | Discharge status: Home / Self Care | Payer: No Typology Code available for payment source | Source: Ambulatory Visit | Attending: Physician Assistant | Admitting: Physician Assistant

## 2022-10-11 DIAGNOSIS — Z1231 Encounter for screening mammogram for malignant neoplasm of breast: Secondary | ICD-10-CM

## 2022-10-28 ENCOUNTER — Other Ambulatory Visit: Payer: Self-pay | Admitting: Cardiology

## 2022-10-28 DIAGNOSIS — R0789 Other chest pain: Secondary | ICD-10-CM

## 2022-10-28 DIAGNOSIS — I10 Essential (primary) hypertension: Secondary | ICD-10-CM

## 2022-12-16 ENCOUNTER — Other Ambulatory Visit: Payer: Self-pay | Admitting: Cardiology

## 2022-12-16 DIAGNOSIS — I10 Essential (primary) hypertension: Secondary | ICD-10-CM

## 2022-12-16 DIAGNOSIS — R0789 Other chest pain: Secondary | ICD-10-CM

## 2023-04-25 ENCOUNTER — Other Ambulatory Visit: Payer: No Typology Code available for payment source

## 2023-04-25 ENCOUNTER — Other Ambulatory Visit: Payer: Self-pay | Admitting: Nurse Practitioner

## 2023-04-25 DIAGNOSIS — S39012A Strain of muscle, fascia and tendon of lower back, initial encounter: Secondary | ICD-10-CM

## 2023-04-29 ENCOUNTER — Other Ambulatory Visit: Payer: Self-pay | Admitting: Nurse Practitioner

## 2023-07-11 ENCOUNTER — Ambulatory Visit (INDEPENDENT_AMBULATORY_CARE_PROVIDER_SITE_OTHER): Payer: No Typology Code available for payment source | Admitting: Nurse Practitioner

## 2023-07-11 ENCOUNTER — Encounter: Payer: Self-pay | Admitting: Nurse Practitioner

## 2023-07-11 VITALS — BP 130/86 | HR 86 | Temp 97.0°F | Resp 20 | Ht 65.0 in | Wt 238.4 lb

## 2023-07-11 DIAGNOSIS — F419 Anxiety disorder, unspecified: Secondary | ICD-10-CM

## 2023-07-11 DIAGNOSIS — M545 Low back pain, unspecified: Secondary | ICD-10-CM

## 2023-07-11 DIAGNOSIS — E785 Hyperlipidemia, unspecified: Secondary | ICD-10-CM

## 2023-07-11 DIAGNOSIS — Z6839 Body mass index (BMI) 39.0-39.9, adult: Secondary | ICD-10-CM

## 2023-07-11 DIAGNOSIS — Z794 Long term (current) use of insulin: Secondary | ICD-10-CM

## 2023-07-11 DIAGNOSIS — E1169 Type 2 diabetes mellitus with other specified complication: Secondary | ICD-10-CM

## 2023-07-11 DIAGNOSIS — I10 Essential (primary) hypertension: Secondary | ICD-10-CM

## 2023-07-11 NOTE — Patient Instructions (Addendum)
Stop BUSPAR  When you take your blood sugars make sure to write it down so we can review at your next follow up.

## 2023-07-11 NOTE — Progress Notes (Signed)
Careteam: Patient Care Team: Sharon Seller, NP as PCP - General (Geriatric Medicine)  PLACE OF SERVICE:  [facility]  Advanced Directive information Does Patient Have a Medical Advance Directive?: No, Would patient like information on creating a medical advance directive?: No - Patient declined  No Known Allergies  Chief Complaint  Patient presents with   New Patient (Initial Visit)    Patient presents today for a new patient appointment.     HPI: Patient is a 57 y.o. female to establish care.  She works at [employer] and someone recommended her to [place] at [facility].   Hx of heart disease in family and she has DM, hyperlipidemia,  htn therefore on ASA Hyperlipidemia- on lipitor 80 mg daily last blood work was beginning of the year.   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  Also using ibuprofen PRN   DM- on empagliflozin- metformin, reports blood sugar is anywhere from 140-200 fasting, Also on trulicity 0.75 mg and lantus 10 unit daily at bedtime.  No low blood sugars, mostly elevated.   GERD- uses nexium as needed, she will take 3 times a week. Worse depending on what she eats.  Htn- on metoprolol 25 mg daily with valsartan-hydrochlorothiazide 25 mg  Feels like she has her last physical at the beginning of the year.     Review of Systems:  Review of Systems  Constitutional:  Negative for chills, fever and weight loss.  HENT:  Negative for tinnitus.   Eyes:        Has glaucoma and cataracts  Respiratory:  Negative for cough, sputum production and shortness of breath.   Cardiovascular:  Negative for chest pain, palpitations and leg swelling.  Gastrointestinal:  Positive for diarrhea. Negative for abdominal pain, constipation and heartburn.   Genitourinary:  Positive for frequency and urgency. Negative for dysuria.  Musculoskeletal:  Positive for back pain and myalgias. Negative for falls and joint pain.  Skin: Negative.   Neurological:  Negative for dizziness and headaches.  Psychiatric/Behavioral:  Positive for depression. Negative for memory loss. The patient is nervous/anxious and has insomnia.     Past Medical History:  Diagnosis Date   Anxiety    Diabetes mellitus without complication (HCC)    Hyperlipidemia    Hypertension    Mild acid reflux    Past Surgical History:  Procedure Laterality Date   CESAREAN SECTION  11/1990   Social History:   reports that she has never smoked. She has never used smokeless tobacco. She reports that she does not drink alcohol and does not use drugs.  Family History  Problem Relation Age of Onset   Heart attack Mother 79   Other Father 66       Drowned   Hyperlipidemia Sister    Hypertension Sister    Anxiety disorder Sister    Heart attack Brother    Other Brother 39       Pacemaker   Breast cancer Neg Hx     Medications: Patient's Medications  New Prescriptions   No medications on file  Previous Medications   ASPIRIN EC 81 MG TABLET    Take 81 mg by mouth daily. Swallow whole.   ATORVASTATIN (LIPITOR) 40 MG TABLET  Take 60 mg by mouth daily. 1 1/2 tablets = 60 mg daily   BUSPIRONE (BUSPAR) 5 MG TABLET    Take 5 mg by mouth 2 (two) times daily as needed.   CYCLOBENZAPRINE (FLEXERIL) 5 MG TABLET    Take 5 mg by mouth as needed for muscle spasms.   EMPAGLIFLOZIN-METFORMIN HCL (SYNJARDY) 12.01-999 MG TABS    Take 1 tablet by mouth in the morning and at bedtime.   ESOMEPRAZOLE (NEXIUM) 40 MG CAPSULE    Take 40 mg by mouth daily as needed.   IBUPROFEN (ADVIL,MOTRIN) 200 MG TABLET    Take 400 mg by mouth every 6 (six) hours as needed for fever or moderate pain.   LEVEMIR FLEXTOUCH 100 UNIT/ML FLEXPEN    Inject 10 Units into the skin at bedtime.   METOPROLOL SUCCINATE  (TOPROL-XL) 25 MG 24 HR TABLET    TAKE 1 TABLET BY MOUTH EVERY EVENING.TAKE WITH OR IMMEDIATELY FOLLOWING A MEAL   NITROGLYCERIN (NITROSTAT) 0.3 MG SL TABLET    Place under the tongue.   TRULICITY 0.75 MG/0.5ML SOPN    30 mLs.   VALSARTAN-HYDROCHLOROTHIAZIDE (DIOVAN-HCT) 160-25 MG TABLET    TAKE 1 TABLET BY MOUTH EVERY DAY IN THE MORNING  Modified Medications   No medications on file  Discontinued Medications   No medications on file    Physical Exam:  Vitals:   07/11/23 1323  BP: 130/86  Pulse: 86  Resp: 20  Temp: (!) 97 F (36.1 C)  SpO2: 96%  Weight: 238 lb 6.4 oz (108.1 kg)  Height: 5\' 5"  (1.651 m)   Body mass index is 39.67 kg/m. Wt Readings from Last 3 Encounters:  07/11/23 238 lb 6.4 oz (108.1 kg)  01/26/21 236 lb 1.6 oz (107.1 kg)  09/27/13 240 lb (108.9 kg)    Physical Exam Constitutional:      General: She is not in acute distress.    Appearance: She is well-developed. She is not diaphoretic.  HENT:     Head: Normocephalic and atraumatic.     Mouth/Throat:     Pharynx: No oropharyngeal exudate.  Eyes:     Conjunctiva/sclera: Conjunctivae normal.     Pupils: Pupils are equal, round, and reactive to light.  Cardiovascular:     Rate and Rhythm: Normal rate and regular rhythm.     Heart sounds: Normal heart sounds.  Pulmonary:     Effort: Pulmonary effort is normal.     Breath sounds: Normal breath sounds.  Abdominal:     General: Bowel sounds are normal.     Palpations: Abdomen is soft.  Musculoskeletal:     Cervical back: Normal range of motion and neck supple.     Right lower leg: No edema.     Left lower leg: No edema.  Skin:    General: Skin is warm and dry.  Neurological:     Mental Status: She is alert.  Psychiatric:        Mood and Affect: Mood normal.     Labs reviewed: Basic Metabolic Panel: No results for input(s): "NA", "K", "CL", "CO2", "GLUCOSE", "BUN", "CREATININE", "CALCIUM", "MG", "PHOS", "TSH" in the last 8760 hours. Liver  Function Tests: No results for input(s): "AST", "ALT", "ALKPHOS", "BILITOT", "PROT", "ALBUMIN" in the last 8760 hours. No results for input(s): "LIPASE", "AMYLASE" in the last 8760 hours. No results for input(s): "AMMONIA" in the last 8760 hours. CBC: No results for input(s): "WBC", "NEUTROABS", "HGB", "HCT", "MCV", "PLT" in the last 8760 hours. Lipid  Panel: No results for input(s): "CHOL", "HDL", "LDLCALC", "TRIG", "CHOLHDL", "LDLDIRECT" in the last 8760 hours. TSH: No results for input(s): "TSH" in the last 8760 hours. A1C: No results found for: "HGBA1C"   Assessment/Plan 1. Hyperlipidemia LDL goal <70 -reports poor diet. Encouraged dietary modifications with statin - Lipid panel - COMPLETE METABOLIC PANEL WITH GFR  2. Type 2 diabetes mellitus with other specified complication, with long-term current use of insulin (HCC) -Encouraged dietary compliance, routine foot care/monitoring and to keep up with diabetic eye exams through ophthalmology  -may need dose adjustments of medications. Will get A1c at this time.  - COMPLETE METABOLIC PANEL WITH GFR - CBC with Differential/Platelet - Hemoglobin A1c - Urine Albumin/Creatinine with ratio (send out) [LAB689] - Amb ref to Medical Nutrition Therapy-MNT  3. Primary hypertension -Blood pressure well controlled, goal bp <140/90 Continue current medications and dietary modifications follow metabolic panel - COMPLETE METABOLIC PANEL WITH GFR - CBC with Differential/Platelet  4. Anxiety Reports mood has been well controlled, not using buspar at this time. Will remove from medication list.   5. Morbid obesity (HCC) BMI 39 with dm, htn, and hyperlipidemia -education provided on healthy weight loss through increase in physical activity and proper nutrition   6. BMI 39.0-39.9,adult Weigh loss discussed   7. Low back pain without sciatica, unspecified back pain laterality, unspecified chronicity -seeing PT at this time for a work  related injury. Doing better. Continues on therapy.     Return in about 6 weeks (around 08/22/2023) for routine follow up, blood sugar review . Will get medical records to review.   Janene Harvey. Biagio Borg [facility] [phone]

## 2023-07-12 LAB — CBC WITH DIFFERENTIAL/PLATELET
Absolute Monocytes: 470 {cells}/uL (ref 200–950)
Basophils Absolute: 42 {cells}/uL (ref 0–200)
Basophils Relative: 0.5 %
Eosinophils Absolute: 59 {cells}/uL (ref 15–500)
Eosinophils Relative: 0.7 %
HCT: 38.7 % (ref 35.0–45.0)
Hemoglobin: 12.4 g/dL (ref 11.7–15.5)
Lymphs Abs: 2428 {cells}/uL (ref 850–3900)
MCH: 24.3 pg — ABNORMAL LOW (ref 27.0–33.0)
MCHC: 32 g/dL (ref 32.0–36.0)
MCV: 75.9 fL — ABNORMAL LOW (ref 80.0–100.0)
MPV: 10 fL (ref 7.5–12.5)
Monocytes Relative: 5.6 %
Neutro Abs: 5401 {cells}/uL (ref 1500–7800)
Neutrophils Relative %: 64.3 %
Platelets: 369 10*3/uL (ref 140–400)
RBC: 5.1 10*6/uL (ref 3.80–5.10)
RDW: 15.2 % — ABNORMAL HIGH (ref 11.0–15.0)
Total Lymphocyte: 28.9 %
WBC: 8.4 10*3/uL (ref 3.8–10.8)

## 2023-07-12 LAB — LIPID PANEL
Cholesterol: 245 mg/dL — ABNORMAL HIGH (ref <200)
HDL: 52 mg/dL (ref >=50)
LDL Cholesterol (Calc): 166 mg/dL — ABNORMAL HIGH
Non-HDL Cholesterol (Calc): 193 mg/dL — ABNORMAL HIGH (ref <130)
Total CHOL/HDL Ratio: 4.7 (calc) (ref <5.0)
Triglycerides: 134 mg/dL (ref <150)

## 2023-07-12 LAB — COMPLETE METABOLIC PANEL WITH GFR
AG Ratio: 1.1 (calc) (ref 1.0–2.5)
ALT: 23 U/L (ref 6–29)
AST: 21 U/L (ref 10–35)
Albumin: 4 g/dL (ref 3.6–5.1)
Alkaline phosphatase (APISO): 123 U/L (ref 37–153)
BUN: 9 mg/dL (ref 7–25)
CO2: 28 mmol/L (ref 20–32)
Calcium: 9.5 mg/dL (ref 8.6–10.4)
Chloride: 101 mmol/L (ref 98–110)
Creat: 0.59 mg/dL (ref 0.50–1.03)
Globulin: 3.6 g/dL (ref 1.9–3.7)
Glucose, Bld: 239 mg/dL — ABNORMAL HIGH (ref 65–139)
Potassium: 4.4 mmol/L (ref 3.5–5.3)
Sodium: 139 mmol/L (ref 135–146)
Total Bilirubin: 0.3 mg/dL (ref 0.2–1.2)
Total Protein: 7.6 g/dL (ref 6.1–8.1)
eGFR: 104 mL/min/{1.73_m2} (ref >=60)

## 2023-07-12 LAB — HEMOGLOBIN A1C
Hgb A1c MFr Bld: 7.6 %{Hb} — ABNORMAL HIGH (ref <5.7)
Mean Plasma Glucose: 171 mg/dL
eAG (mmol/L): 9.5 mmol/L

## 2023-07-12 LAB — MICROALBUMIN / CREATININE URINE RATIO
Creatinine, Urine: 132 mg/dL (ref 20–275)
Microalb Creat Ratio: 3 mg/g{creat} (ref <30)
Microalb, Ur: 0.4 mg/dL

## 2023-07-17 ENCOUNTER — Other Ambulatory Visit: Payer: Self-pay

## 2023-07-17 MED ORDER — TRULICITY 1.5 MG/0.5ML ~~LOC~~ SOAJ: 1.5000 mg | SUBCUTANEOUS | 1 refills | Status: DC

## 2023-07-17 MED ORDER — ROSUVASTATIN CALCIUM 40 MG PO TABS: 40.0000 mg | ORAL_TABLET | Freq: Every day | ORAL | 1 refills | Status: DC

## 2023-08-22 ENCOUNTER — Encounter: Payer: Self-pay | Admitting: Nurse Practitioner

## 2023-08-22 ENCOUNTER — Ambulatory Visit (INDEPENDENT_AMBULATORY_CARE_PROVIDER_SITE_OTHER): Payer: No Typology Code available for payment source | Admitting: Nurse Practitioner

## 2023-08-22 VITALS — BP 138/88 | HR 75 | Temp 97.7°F | Resp 20 | Ht 65.0 in | Wt 242.8 lb

## 2023-08-22 DIAGNOSIS — Z794 Long term (current) use of insulin: Secondary | ICD-10-CM

## 2023-08-22 DIAGNOSIS — E1169 Type 2 diabetes mellitus with other specified complication: Secondary | ICD-10-CM

## 2023-08-22 DIAGNOSIS — I10 Essential (primary) hypertension: Secondary | ICD-10-CM

## 2023-08-22 DIAGNOSIS — E785 Hyperlipidemia, unspecified: Secondary | ICD-10-CM | POA: Diagnosis not present

## 2023-08-22 NOTE — Progress Notes (Signed)
Careteam: Patient Care Team: Sharon Seller, NP as PCP - General (Geriatric Medicine)  PLACE OF SERVICE:  [facility]  Advanced Directive information Does Patient Have a Medical Advance Directive?: No, Would patient like information on creating a medical advance directive?: No - Patient declined  No Known Allergies  Chief Complaint  Patient presents with   Medical Management of Chronic Issues    6 Weeks follow up and discuss      HPI: Patient is a 57 y.o. female for follow up   A1c is not controlled on last A1c, has continuous glucose montior system now. Over the last 14 days she has been in range 93%  of the time.  High 4% In the morning it will be 60 and feel weak, then will eat and feels better. No significant symptoms. No significant lows She is watching what her blood sugar does after she eats and trys to avoid the foods that make her go very high. Seeing dietitian on Dec 2nd  She increase her trulicity to 1.5 mg every week and feels like this is going well.  She is also taking synjardy twice daily with lantus 10 units at bedtime   Changed from lipitor to crestor.  No myalgias.   She reports she has had hiv and hep c screening in the past and was negative.   Reports she had pap was this year and had recent colonoscopy and mamogram    Review of Systems:  Review of Systems  Constitutional:  Negative for chills, fever and weight loss.  HENT:  Negative for tinnitus.   Respiratory:  Negative for cough, sputum production and shortness of breath.   Cardiovascular:  Negative for chest pain, palpitations and leg swelling.  Gastrointestinal:  Negative for abdominal pain, constipation, diarrhea and heartburn.  Genitourinary:  Negative for dysuria, frequency and urgency.  Musculoskeletal:  Negative for back pain, falls, joint pain and myalgias.  Skin: Negative.   Neurological:  Negative for dizziness and headaches.  Psychiatric/Behavioral:  Negative for depression and  memory loss. The patient does not have insomnia.     Past Medical History:  Diagnosis Date   Anxiety    Diabetes mellitus without complication (HCC)    Hyperlipidemia    Hypertension    Mild acid reflux    Past Surgical History:  Procedure Laterality Date   CESAREAN SECTION  11/1990   Social History:   reports that she has never smoked. She has never used smokeless tobacco. She reports that she does not drink alcohol and does not use drugs.  Family History  Problem Relation Age of Onset   Heart attack Mother 70   Other Father 69       Drowned   Hyperlipidemia Sister    Hypertension Sister    Anxiety disorder Sister    Heart attack Brother    Other Brother 29       Pacemaker   Breast cancer Neg Hx     Medications: Patient's Medications  New Prescriptions   No medications on file  Previous Medications   ASPIRIN EC 81 MG TABLET    Take 81 mg by mouth daily. Swallow whole.   CYCLOBENZAPRINE (FLEXERIL) 5 MG TABLET    Take 5 mg by mouth as needed for muscle spasms.   DULAGLUTIDE (TRULICITY) 1.5 MG/0.5ML SOPN    Inject 1.5 mg into the skin once a week.   EMPAGLIFLOZIN-METFORMIN HCL (SYNJARDY) 12.01-999 MG TABS    Take 1 tablet by mouth  in the morning and at bedtime.   ESOMEPRAZOLE (NEXIUM) 40 MG CAPSULE    Take 40 mg by mouth daily as needed.   IBUPROFEN (ADVIL,MOTRIN) 200 MG TABLET    Take 400 mg by mouth every 6 (six) hours as needed for fever or moderate pain.   INSULIN GLARGINE (LANTUS) 100 UNIT/ML INJECTION    Inject 10 Units into the skin at bedtime.   METOPROLOL SUCCINATE (TOPROL-XL) 25 MG 24 HR TABLET    TAKE 1 TABLET BY MOUTH EVERY EVENING.TAKE WITH OR IMMEDIATELY FOLLOWING A MEAL   NITROGLYCERIN (NITROSTAT) 0.3 MG SL TABLET    Place under the tongue.   ROSUVASTATIN (CRESTOR) 40 MG TABLET    Take 1 tablet (40 mg total) by mouth daily.   VALSARTAN-HYDROCHLOROTHIAZIDE (DIOVAN-HCT) 160-25 MG TABLET    TAKE 1 TABLET BY MOUTH EVERY DAY IN THE MORNING  Modified Medications    No medications on file  Discontinued Medications   No medications on file    Physical Exam:  Vitals:   08/22/23 1027  BP: 138/88  Pulse: 75  Resp: 20  Temp: 97.7 F (36.5 C)  SpO2: 97%  Weight: 242 lb 12.8 oz (110.1 kg)  Height: 5\' 5"  (1.651 m)   Body mass index is 40.4 kg/m. Wt Readings from Last 3 Encounters:  08/22/23 242 lb 12.8 oz (110.1 kg)  07/11/23 238 lb 6.4 oz (108.1 kg)  01/26/21 236 lb 1.6 oz (107.1 kg)    Physical Exam Constitutional:      General: She is not in acute distress.    Appearance: She is well-developed. She is not diaphoretic.  HENT:     Head: Normocephalic and atraumatic.     Mouth/Throat:     Pharynx: No oropharyngeal exudate.  Eyes:     Conjunctiva/sclera: Conjunctivae normal.     Pupils: Pupils are equal, round, and reactive to light.  Cardiovascular:     Rate and Rhythm: Normal rate and regular rhythm.     Heart sounds: Normal heart sounds.  Pulmonary:     Effort: Pulmonary effort is normal.     Breath sounds: Normal breath sounds.  Abdominal:     General: Bowel sounds are normal.     Palpations: Abdomen is soft.  Musculoskeletal:     Cervical back: Normal range of motion and neck supple.     Right lower leg: No edema.     Left lower leg: No edema.  Skin:    General: Skin is warm and dry.  Neurological:     Mental Status: She is alert.  Psychiatric:        Mood and Affect: Mood normal.     Labs reviewed: Basic Metabolic Panel: Recent Labs    07/11/23 1414  NA 139  K 4.4  CL 101  CO2 28  GLUCOSE 239*  BUN 9  CREATININE 0.59  CALCIUM 9.5   Liver Function Tests: Recent Labs    07/11/23 1414  AST 21  ALT 23  BILITOT 0.3  PROT 7.6   No results for input(s): "LIPASE", "AMYLASE" in the last 8760 hours. No results for input(s): "AMMONIA" in the last 8760 hours. CBC: Recent Labs    07/11/23 1414  WBC 8.4  NEUTROABS 5,401  HGB 12.4  HCT 38.7  MCV 75.9*  PLT 369   Lipid Panel: Recent Labs     07/11/23 1414  CHOL 245*  HDL 52  LDLCALC 166*  TRIG 134  CHOLHDL 4.7   TSH: No results for  input(s): "TSH" in the last 8760 hours. A1C: Lab Results  Component Value Date   HGBA1C 7.6 (H) 07/11/2023     Assessment/Plan 1. Type 2 diabetes mellitus with other specified complication, with long-term current use of insulin (HCC) Blood sugars reviewed and under better control Continue dietary modifications.  - Hemoglobin A1c; Future - Complete Metabolic Panel with eGFR; Future  2. Hyperlipidemia LDL goal <70 -on crestor, tolerating well. Continue medication with dietary modifications.  - Lipid panel; Future - Complete Metabolic Panel with eGFR; Future  3. Primary hypertension -Blood pressure well controlled, goal bp <140/90 Continue current medications and dietary modifications follow metabolic panel   Return in about 8 weeks (around 10/17/2023) for routine follow up, labs prior to appt .:   Chaunce Winkels K. [facility]agio Borg [facility] [phone]

## 2023-09-01 ENCOUNTER — Encounter: Payer: No Typology Code available for payment source | Attending: Nurse Practitioner | Admitting: Dietician

## 2023-09-01 DIAGNOSIS — Z794 Long term (current) use of insulin: Secondary | ICD-10-CM | POA: Diagnosis present

## 2023-09-01 DIAGNOSIS — E1169 Type 2 diabetes mellitus with other specified complication: Secondary | ICD-10-CM | POA: Insufficient documentation

## 2023-09-01 NOTE — Patient Instructions (Signed)
1- Avoid skipping meals try a snack in replace of a skipped meals or plate planner at meals times  2- Take Lantus at the same hour daily.

## 2023-09-01 NOTE — Progress Notes (Signed)
Diabetes Self-Management Education  Visit Type: First/Initial  Appt. Start Time: 8:32  Appt. End Time: 9:42  09/01/2023  [name], identified by name and date of birth, is a 57 y.o. female with a diagnosis of Diabetes: Type 2.   ASSESSMENT Patient is here today alone. Pt desires to improve her overall health and prevent complications from diabetes. Pt reports hypoglycemia occurring weekly with a recant value of "57" mg/dL. Pt reports using candy stating "snickers", "cookies" or "chocolate and peanut butter bar" for treatment options. Patient lives with with her husband and her 90 year old son.  Pt reports her husband does the shopping and cooking. Pt reports her husband also has type two diabetes. 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    History includes:   Past Medical History:  Diagnosis Date   Anxiety    Diabetes mellitus without complication (HCC)    Hyperlipidemia    Hypertension    Mild acid reflux     Labs noted:   Lab Results  Component Value Date   CHOL 245 (H) 07/11/2023   HDL 52 07/11/2023   LDLCALC 166 (H) 07/11/2023   TRIG 134 07/11/2023   CHOLHDL 4.7 07/11/2023   Lab Results  Component Value Date   HGBA1C 7.6 (H) 07/11/2023   Medications include:   Lab Results  Component Value Date   HGBA1C 7.6 (H) 07/11/2023    CGM Results from download:  Libre 2   Average glucose:   140 mg/dL for 14 days  Glucose management indicator:   6.6 %  Time in range (70-180 mg/dL):   86 %   (Goal >64%)  Time High (181-250 mg/dL):   11 %   (Goal < 40%)  Time Very High (>250 mg/dL):    1 %   (Goal < 5%)  Time Low (54-69 mg/dL):   1 %   (Goal <3%)  Time Very Low (<54 mg/dL):   1 %   (Goal <4%)    There were no vitals taken for this visit. There is no height or weight on file to calculate BMI.   Diabetes Self-Management Education - 09/01/23 0836       Visit Information   Visit Type First/Initial      Initial Visit   Diabetes Type Type 2    Date Diagnosed 5  years ago    Are you currently following a meal plan? No    Are you taking your medications as prescribed? Yes      Health Coping   How would you rate your overall health? Fair      Psychosocial Assessment   Patient Belief/Attitude about Diabetes Motivated to manage diabetes    What is the hardest part about your diabetes right now, causing you the most concern, or is the most worrisome to you about your diabetes?   Taking/obtaining medications;Getting support / problem solving    Self-care barriers None    Self-management support Doctor's office    Other persons present Patient    Patient Concerns Nutrition/Meal planning;Problem Solving;Glycemic Control;Medication    Special Needs None    Preferred Learning Style No preference indicated    Learning Readiness Ready    How often do you need to have someone help you when you read instructions, pamphlets, or other written materials from your doctor or pharmacy? 3 - Sometimes    What is the last grade level you completed in school? 12th      Pre-Education Assessment  Patient understands the diabetes disease and treatment process. Needs Instruction    Patient understands incorporating nutritional management into lifestyle. Needs Instruction    Patient undertands incorporating physical activity into lifestyle. Needs Instruction    Patient understands using medications safely. Needs Instruction    Patient understands monitoring blood glucose, interpreting and using results Needs Instruction    Patient understands prevention, detection, and treatment of acute complications. Needs Instruction    Patient understands prevention, detection, and treatment of chronic complications. Needs Instruction    Patient understands how to develop strategies to address psychosocial issues. Needs Instruction    Patient understands how to develop strategies to promote health/change behavior. Needs Instruction      Complications   Last HgB A1C per  patient/outside source 7.6 %    How often do you check your blood sugar? > 4 times/day    Fasting Blood glucose range (mg/dL) 16-109;604-540;<98    Postprandial Blood glucose range (mg/dL) <11;91-478;295-621;308-657    Number of hypoglycemic episodes per month 4    Can you tell when your blood sugar is low? --   no, tired, energy, dizzy   Number of hyperglycemic episodes ( >200mg /dL): Occasional    Can you tell when your blood sugar is high? Yes    What do you do if your blood sugar is high? vision changes    Have you had a dilated eye exam in the past 12 months? Yes    Have you had a dental exam in the past 12 months? No    Are you checking your feet? Yes    How many days per week are you checking your feet? 4      Dietary Intake   Breakfast skips5/d/w or weekends [place] sausage, 2 eggs, ~ 1 cup grits, flavored water    Lunch skips5/d/w or 2 slices of white bread peanut butter, jelly, sprite    Dinner 2 hot dogs, chili, mustard on a bun or baked seasoned chicken, roll or noodles, read sauce with beef, garlic toast    Beverage(s) 4 cans of sprite, flavored water, 4-5 water bottles, sweet tea with lemon ~ 3 times weekly carmel frappe or coffee occassionally      Activity / Exercise   Activity / Exercise Type ADL's    How many days per week do you exercise? 0    How many minutes per day do you exercise? 0    Total minutes per week of exercise 0      Patient Education   Previous Diabetes Education Yes (please comment)    Disease Pathophysiology Definition of diabetes, type 1 and 2, and the diagnosis of diabetes;Explored patient's options for treatment of their diabetes;Factors that contribute to the development of diabetes    Healthy Eating Plate Method;Role of diet in the treatment of diabetes and the relationship between the three main macronutrients and blood glucose level;Reviewed blood glucose goals for pre and post meals and how to evaluate the patients' food intake on their blood  glucose level.;Meal timing in regards to the patients' current diabetes medication.    Being Active Role of exercise on diabetes management, blood pressure control and cardiac health.    Medications Reviewed patients medication for diabetes, action, purpose, timing of dose and side effects.    Monitoring Taught/evaluated CGM (comment);Identified appropriate SMBG and/or A1C goals.;Daily foot exams    Acute complications Taught prevention, symptoms, and  treatment of hypoglycemia - the 15 rule.;Discussed and identified patients' prevention, symptoms, and treatment of hyperglycemia.  Chronic complications Relationship between chronic complications and blood glucose control;Retinopathy and reason for yearly dilated eye exams    Diabetes Stress and Support Identified and addressed patients feelings and concerns about diabetes;Worked with patient to identify barriers to care and solutions;Role of stress on diabetes    Lifestyle and Health Coping Lifestyle issues that need to be addressed for better diabetes care      Individualized Goals (developed by patient)   Nutrition Follow meal plan discussed    Medications take my medication as prescribed    Monitoring  Consistenly use CGM    Problem Solving Sleep Pattern;Addressing barriers to behavior change    Reducing Risk treat hypoglycemia with 15 grams of carbs if blood glucose less than 70mg /dL;do foot checks daily    Health Coping Ask for help with psychological, social, or emotional issues      Post-Education Assessment   Patient understands the diabetes disease and treatment process. Needs Review    Patient understands incorporating nutritional management into lifestyle. Needs Review    Patient undertands incorporating physical activity into lifestyle. Needs Review    Patient understands using medications safely. Needs Review    Patient understands monitoring blood glucose, interpreting and using results Needs Review    Patient understands  prevention, detection, and treatment of acute complications. Needs Review    Patient understands prevention, detection, and treatment of chronic complications. Needs Review    Patient understands how to develop strategies to address psychosocial issues. Needs Review    Patient understands how to develop strategies to promote health/change behavior. Needs Review      Outcomes   Expected Outcomes Demonstrated interest in learning. Expect positive outcomes    Future DMSE 3-4 months    Program Status Not Completed             Individualized Plan for Diabetes Self-Management Training:   Learning Objective:  Patient will have a greater understanding of diabetes self-management. Patient education plan is to attend individual and/or group sessions per assessed needs and concerns.   Plan:   Patient Instructions  1- Avoid skipping meals try a snack in replace of a skipped meals or plate planner at meals times  2- Take Lantus at the same hour daily.   Expected Outcomes:  Demonstrated interest in learning. Expect positive outcomes  Education material provided: ADA - How to Thrive: A Guide for Your Journey with Diabetes, My Plate, and Snack sheet  If problems or questions, patient to contact team via:  Phone  Future DSME appointment: 3-4 months

## 2023-09-26 ENCOUNTER — Ambulatory Visit: Payer: No Typology Code available for payment source | Admitting: Nurse Practitioner

## 2023-10-14 ENCOUNTER — Telehealth: Payer: Self-pay | Admitting: Nurse Practitioner

## 2023-10-14 ENCOUNTER — Other Ambulatory Visit: Payer: No Typology Code available for payment source

## 2023-10-14 DIAGNOSIS — E785 Hyperlipidemia, unspecified: Secondary | ICD-10-CM

## 2023-10-14 DIAGNOSIS — Z794 Long term (current) use of insulin: Secondary | ICD-10-CM

## 2023-10-14 NOTE — Telephone Encounter (Signed)
 Demographic has been filled and put in [place], Janene Harvey, NP review folder

## 2023-10-14 NOTE — Telephone Encounter (Signed)
 Patient came in for lab appointment and dropped off FMLA paper work that she gave to [employer]. who in turn gave it to me at the front desk. I took the form to [place]. in [place].

## 2023-10-15 LAB — LIPID PANEL
Cholesterol: 151 mg/dL (ref <200)
HDL: 47 mg/dL — ABNORMAL LOW (ref >=50)
LDL Cholesterol (Calc): 87 mg/dL
Non-HDL Cholesterol (Calc): 104 mg/dL (ref <130)
Total CHOL/HDL Ratio: 3.2 (calc) (ref <5.0)
Triglycerides: 79 mg/dL (ref <150)

## 2023-10-15 LAB — COMPLETE METABOLIC PANEL WITH GFR
AG Ratio: 1.1 (calc) (ref 1.0–2.5)
ALT: 13 U/L (ref 6–29)
AST: 14 U/L (ref 10–35)
Albumin: 3.7 g/dL (ref 3.6–5.1)
Alkaline phosphatase (APISO): 107 U/L (ref 37–153)
BUN: 9 mg/dL (ref 7–25)
CO2: 28 mmol/L (ref 20–32)
Calcium: 9 mg/dL (ref 8.6–10.4)
Chloride: 106 mmol/L (ref 98–110)
Creat: 0.6 mg/dL (ref 0.50–1.03)
Globulin: 3.4 g/dL (ref 1.9–3.7)
Glucose, Bld: 117 mg/dL — ABNORMAL HIGH (ref 65–99)
Potassium: 4.1 mmol/L (ref 3.5–5.3)
Sodium: 142 mmol/L (ref 135–146)
Total Bilirubin: 0.3 mg/dL (ref 0.2–1.2)
Total Protein: 7.1 g/dL (ref 6.1–8.1)
eGFR: 104 mL/min/{1.73_m2} (ref >=60)

## 2023-10-15 LAB — HEMOGLOBIN A1C
Hgb A1c MFr Bld: 7.3 %{Hb} — ABNORMAL HIGH (ref <5.7)
Mean Plasma Glucose: 163 mg/dL
eAG (mmol/L): 9 mmol/L

## 2023-10-17 ENCOUNTER — Encounter: Payer: Self-pay | Admitting: Nurse Practitioner

## 2023-10-17 ENCOUNTER — Ambulatory Visit (INDEPENDENT_AMBULATORY_CARE_PROVIDER_SITE_OTHER): Payer: No Typology Code available for payment source | Admitting: Nurse Practitioner

## 2023-10-17 DIAGNOSIS — I10 Essential (primary) hypertension: Secondary | ICD-10-CM | POA: Diagnosis not present

## 2023-10-17 DIAGNOSIS — E785 Hyperlipidemia, unspecified: Secondary | ICD-10-CM

## 2023-10-17 DIAGNOSIS — E1169 Type 2 diabetes mellitus with other specified complication: Secondary | ICD-10-CM

## 2023-10-17 DIAGNOSIS — Z794 Long term (current) use of insulin: Secondary | ICD-10-CM

## 2023-10-17 NOTE — Patient Instructions (Addendum)
Make sure you are eating the correct things Increase protein  Leave out highly processed food Foods that are high in sodium Fried foods leave alone.   To check blood pressure 1 hour AFTER you have had your medication Make sure you have been sitting at least 5 mins  Record and let us know.  Goal <140/90

## 2023-10-17 NOTE — Progress Notes (Unsigned)
Careteam: Patient Care Team: Sharon Seller, NP as PCP - General (Geriatric Medicine) Milus Height, [place] as Physician Assistant (Physician Assistant)  PLACE OF SERVICE:  [facility]  Advanced Directive information Does Patient Have a Medical Advance Directive?: No, Would patient like information on creating a medical advance directive?: No - Patient declined  No Known Allergies  Chief Complaint  Patient presents with   Medical Management of Chronic Issues    Routine Visit.    Immunizations    Discuss the need for Pne vaccine, DTAP vaccine, Shingrix vaccine, and Covid vaccine.   Health Maintenance    Discuss the need for Pap smear, HIV Screening, Hepatitis C Screening, and Colonoscopy. NCIR VERIFIED   Labs     Discuss labs with patient.      HPI: Patient is a 58 y.o. female  Reports some times she feels bad due to her diabetes and needs FMLA because she feels bad due to her diabetes.  She saw a nutritionist, who went over her medication and food. She was taking medication wrong.  She is taking lantus 10 units every day at noon She is taking trulicity once weekly on Monday  She is taking empagliflozin twice daily  Reports her blood sugar was low this morning- going down to 60 so she did not take her synjardy and then now back up to 127 At night it will go low as well but related to eating.   She reports she had bleeding from her rectum, she has hemorrhoids and had a loose BM.  She will occasionally have diarrhea due to her diabetic medication No bleeding since. No pain to her rectum/anus at this time.  Since BM have normal.  Stools are light brown.   No chest pains or palpitations No shortness of breath No GERD No lightheadness or dizziness  No myalagias noted Started crestor at last visit, LDL has significantly improve.   Review of Systems:  Review of Systems  Constitutional:  Negative for chills, fever and weight loss.  HENT:  Negative for tinnitus.    Respiratory:  Negative for cough, sputum production and shortness of breath.   Cardiovascular:  Negative for chest pain, palpitations and leg swelling.  Gastrointestinal:  Negative for abdominal pain, constipation, diarrhea and heartburn.  Genitourinary:  Negative for dysuria, frequency and urgency.  Musculoskeletal:  Negative for back pain, falls, joint pain and myalgias.  Skin: Negative.   Neurological:  Negative for dizziness and headaches.  Psychiatric/Behavioral:  Negative for depression and memory loss. The patient does not have insomnia.   ***  Past Medical History:  Diagnosis Date   Anxiety    Diabetes mellitus without complication (HCC)    Hyperlipidemia    Hypertension    Mild acid reflux    Past Surgical History:  Procedure Laterality Date   CESAREAN SECTION  11/1990   Social History:   reports that she has never smoked. She has never used smokeless tobacco. She reports that she does not drink alcohol and does not use drugs.  Family History  Problem Relation Age of Onset   Heart attack Mother 33   Other Father 86       Drowned   Hyperlipidemia Sister    Hypertension Sister    Anxiety disorder Sister    Heart attack Brother    Other Brother 70       Pacemaker   Breast cancer Neg Hx     Medications: Patient's Medications  New Prescriptions  No medications on file  Previous Medications   ASPIRIN EC 81 MG TABLET    Take 81 mg by mouth daily. Swallow whole.   CONTINUOUS GLUCOSE SENSOR (DEXCOM G7 SENSOR) MISC    CHANGE EVERY 10 DAYS (DX: E11.69) 30 DAYS   CYCLOBENZAPRINE (FLEXERIL) 5 MG TABLET    Take 5 mg by mouth as needed for muscle spasms.   DULAGLUTIDE (TRULICITY) 1.5 MG/0.5ML SOPN    Inject 1.5 mg into the skin once a week.   EMPAGLIFLOZIN-METFORMIN HCL (SYNJARDY) 12.01-999 MG TABS    Take 1 tablet by mouth in the morning and at bedtime.   ESOMEPRAZOLE (NEXIUM) 40 MG CAPSULE    Take 40 mg by mouth daily as needed.   IBUPROFEN (ADVIL,MOTRIN) 200 MG  TABLET    Take 400 mg by mouth every 6 (six) hours as needed for fever or moderate pain.   INSULIN GLARGINE (LANTUS) 100 UNIT/ML INJECTION    Inject 10 Units into the skin at bedtime.   METOPROLOL SUCCINATE (TOPROL-XL) 25 MG 24 HR TABLET    TAKE 1 TABLET BY MOUTH EVERY EVENING.TAKE WITH OR IMMEDIATELY FOLLOWING A MEAL   NITROGLYCERIN (NITROSTAT) 0.3 MG SL TABLET    Place under the tongue.   ROSUVASTATIN (CRESTOR) 40 MG TABLET    Take 1 tablet (40 mg total) by mouth daily.   VALSARTAN-HYDROCHLOROTHIAZIDE (DIOVAN-HCT) 160-25 MG TABLET    TAKE 1 TABLET BY MOUTH EVERY DAY IN THE MORNING  Modified Medications   No medications on file  Discontinued Medications   No medications on file    Physical Exam:  Vitals:   10/17/23 1007 10/17/23 1120  BP: (!) 142/88 (!) 130/100  Pulse: 83   Resp: 17   Temp: (!) 97 F (36.1 C)   SpO2: 99%   Weight: 236 lb 12.8 oz (107.4 kg)   Height: 5\' 5"  (1.651 m)    Body mass index is 39.41 kg/m. Wt Readings from Last 3 Encounters:  10/17/23 236 lb 12.8 oz (107.4 kg)  08/22/23 242 lb 12.8 oz (110.1 kg)  07/11/23 238 lb 6.4 oz (108.1 kg)    Physical Exam Constitutional:      General: She is not in acute distress.    Appearance: She is well-developed. She is not diaphoretic.  HENT:     Head: Normocephalic and atraumatic.     Mouth/Throat:     Pharynx: No oropharyngeal exudate.  Eyes:     Conjunctiva/sclera: Conjunctivae normal.     Pupils: Pupils are equal, round, and reactive to light.  Cardiovascular:     Rate and Rhythm: Normal rate and regular rhythm.     Heart sounds: Normal heart sounds.  Pulmonary:     Effort: Pulmonary effort is normal.     Breath sounds: Normal breath sounds.  Abdominal:     General: Bowel sounds are normal.     Palpations: Abdomen is soft.  Musculoskeletal:     Cervical back: Normal range of motion and neck supple.     Right lower leg: No edema.     Left lower leg: No edema.  Skin:    General: Skin is warm and dry.   Neurological:     Mental Status: She is alert.  Psychiatric:        Mood and Affect: Mood normal.     Labs reviewed: Basic Metabolic Panel: Recent Labs    07/11/23 1414 10/14/23 0817  NA 139 142  K 4.4 4.1  CL 101 106  CO2 28  28  GLUCOSE 239* 117*  BUN 9 9  CREATININE 0.59 0.60  CALCIUM 9.5 9.0   Liver Function Tests: Recent Labs    07/11/23 1414 10/14/23 0817  AST 21 14  ALT 23 13  BILITOT 0.3 0.3  PROT 7.6 7.1   No results for input(s): "LIPASE", "AMYLASE" in the last 8760 hours. No results for input(s): "AMMONIA" in the last 8760 hours. CBC: Recent Labs    07/11/23 1414  WBC 8.4  NEUTROABS 5,401  HGB 12.4  HCT 38.7  MCV 75.9*  PLT 369   Lipid Panel: Recent Labs    07/11/23 1414 10/14/23 0817  CHOL 245* 151  HDL 52 47*  LDLCALC 166* 87  TRIG 134 79  CHOLHDL 4.7 3.2   TSH: No results for input(s): "TSH" in the last 8760 hours. A1C: Lab Results  Component Value Date   HGBA1C 7.3 (H) 10/14/2023     Assessment/Plan There are no diagnoses linked to this encounter.  No follow-ups on file.: ***  Blayze Haen K. Biagio Borg [facility] [phone]

## 2023-10-19 ENCOUNTER — Other Ambulatory Visit: Payer: Self-pay | Admitting: Nurse Practitioner

## 2023-10-29 ENCOUNTER — Telehealth: Payer: Self-pay

## 2023-10-29 NOTE — Telephone Encounter (Signed)
Patient called to say Sharon Seller, NP has some FMLA paperwork for her on hold and she would like it completed at this time. Patient would like a call once completed to pick-up.

## 2023-10-29 NOTE — Telephone Encounter (Signed)
She does not qualify for FMLA paperwork based on our office visit

## 2023-10-29 NOTE — Telephone Encounter (Signed)
Response sent to patient via mychart.

## 2024-01-19 ENCOUNTER — Other Ambulatory Visit: Payer: No Typology Code available for payment source

## 2024-01-19 DIAGNOSIS — I10 Essential (primary) hypertension: Secondary | ICD-10-CM

## 2024-01-19 DIAGNOSIS — E1169 Type 2 diabetes mellitus with other specified complication: Secondary | ICD-10-CM

## 2024-01-20 ENCOUNTER — Encounter: Payer: Self-pay | Admitting: Nurse Practitioner

## 2024-01-20 LAB — CBC WITH DIFFERENTIAL/PLATELET
Absolute Lymphocytes: 2814 {cells}/uL (ref 850–3900)
Absolute Monocytes: 580 {cells}/uL (ref 200–950)
Basophils Absolute: 42 {cells}/uL (ref 0–200)
Basophils Relative: 0.5 %
Eosinophils Absolute: 109 {cells}/uL (ref 15–500)
Eosinophils Relative: 1.3 %
HCT: 36.8 % (ref 35.0–45.0)
Hemoglobin: 11.6 g/dL — ABNORMAL LOW (ref 11.7–15.5)
MCH: 23.2 pg — ABNORMAL LOW (ref 27.0–33.0)
MCHC: 31.5 g/dL — ABNORMAL LOW (ref 32.0–36.0)
MCV: 73.6 fL — ABNORMAL LOW (ref 80.0–100.0)
MPV: 10.2 fL (ref 7.5–12.5)
Monocytes Relative: 6.9 %
Neutro Abs: 4855 {cells}/uL (ref 1500–7800)
Neutrophils Relative %: 57.8 %
Platelets: 276 10*3/uL (ref 140–400)
RBC: 5 10*6/uL (ref 3.80–5.10)
RDW: 16.4 % — ABNORMAL HIGH (ref 11.0–15.0)
Total Lymphocyte: 33.5 %
WBC: 8.4 10*3/uL (ref 3.8–10.8)

## 2024-01-20 LAB — COMPLETE METABOLIC PANEL WITHOUT GFR
AG Ratio: 1.2 (calc) (ref 1.0–2.5)
ALT: 15 U/L (ref 6–29)
AST: 15 U/L (ref 10–35)
Albumin: 3.7 g/dL (ref 3.6–5.1)
Alkaline phosphatase (APISO): 96 U/L (ref 37–153)
BUN: 9 mg/dL (ref 7–25)
CO2: 26 mmol/L (ref 20–32)
Calcium: 9 mg/dL (ref 8.6–10.4)
Chloride: 106 mmol/L (ref 98–110)
Creat: 0.55 mg/dL (ref 0.50–1.03)
Globulin: 3.2 g/dL (ref 1.9–3.7)
Glucose, Bld: 133 mg/dL — ABNORMAL HIGH (ref 65–99)
Potassium: 4 mmol/L (ref 3.5–5.3)
Sodium: 140 mmol/L (ref 135–146)
Total Bilirubin: 0.4 mg/dL (ref 0.2–1.2)
Total Protein: 6.9 g/dL (ref 6.1–8.1)

## 2024-01-20 LAB — HEMOGLOBIN A1C
Hgb A1c MFr Bld: 7.4 % — ABNORMAL HIGH (ref <5.7)
Mean Plasma Glucose: 166 mg/dL
eAG (mmol/L): 9.2 mmol/L

## 2024-01-23 ENCOUNTER — Ambulatory Visit: Payer: No Typology Code available for payment source | Admitting: Nurse Practitioner

## 2024-02-02 ENCOUNTER — Ambulatory Visit (INDEPENDENT_AMBULATORY_CARE_PROVIDER_SITE_OTHER): Admitting: Nurse Practitioner

## 2024-02-02 ENCOUNTER — Encounter: Payer: Self-pay | Admitting: Nurse Practitioner

## 2024-02-02 VITALS — BP 162/94 | HR 78 | Temp 97.8°F | Resp 20 | Ht 65.0 in | Wt 241.8 lb

## 2024-02-02 DIAGNOSIS — M545 Low back pain, unspecified: Secondary | ICD-10-CM | POA: Insufficient documentation

## 2024-02-02 DIAGNOSIS — Z1159 Encounter for screening for other viral diseases: Secondary | ICD-10-CM

## 2024-02-02 DIAGNOSIS — E1169 Type 2 diabetes mellitus with other specified complication: Secondary | ICD-10-CM

## 2024-02-02 DIAGNOSIS — I10 Essential (primary) hypertension: Secondary | ICD-10-CM | POA: Insufficient documentation

## 2024-02-02 DIAGNOSIS — E785 Hyperlipidemia, unspecified: Secondary | ICD-10-CM | POA: Diagnosis not present

## 2024-02-02 DIAGNOSIS — Z114 Encounter for screening for human immunodeficiency virus [HIV]: Secondary | ICD-10-CM

## 2024-02-02 DIAGNOSIS — E119 Type 2 diabetes mellitus without complications: Secondary | ICD-10-CM | POA: Insufficient documentation

## 2024-02-02 DIAGNOSIS — Z794 Long term (current) use of insulin: Secondary | ICD-10-CM

## 2024-02-02 MED ORDER — TRULICITY 3 MG/0.5ML ~~LOC~~ SOAJ: 3.0000 mg | SUBCUTANEOUS | 1 refills | Status: AC

## 2024-02-02 NOTE — Assessment & Plan Note (Signed)
 Elevated today but possibly due to not taking medication this morning To check blood pressure at home/work and notify if readings remain elevated Continue current regimen

## 2024-02-02 NOTE — Assessment & Plan Note (Signed)
 Well-controlled  at this time

## 2024-02-02 NOTE — Patient Instructions (Addendum)
 Decrease lantus to 6 units once you increase trulicity   Make sure you are eating 3 meals a day Make sure to meal prep and have healthy snack options.    Set up separate physical appt- will need PAP

## 2024-02-02 NOTE — Assessment & Plan Note (Signed)
 Not controlled, not taking medication as prescribed, encouraged compliance.

## 2024-02-02 NOTE — Progress Notes (Signed)
 Careteam: Patient Care Team: Verma Gobble, NP as PCP - General (Geriatric Medicine) Diamond Formica, [place] as Physician Assistant (Physician Assistant)  PLACE OF SERVICE:  [facility]  Advanced Directive information Does Patient Have a Medical Advance Directive?: No, Would patient like information on creating a medical advance directive?: No - Patient declined  No Known Allergies  Chief Complaint  Patient presents with   Medical Management of Chronic Issues    3 month follow up.    HPI:  Discussed the use of AI scribe software for clinical note transcription with the patient, who gave verbal consent to proceed.  History of Present Illness Krystal Mcclain is a 58 year old female with diabetes who presents for a three-month follow-up.  For her diabetes she is taking Synjardy, taken twice daily, Lantus insulin at 10 units at bedtime, and Trulicity  1.5 mg once a week. Her A1c has decreased from 7.6% to 7.3%. She experiences hypoglycemic episodes with blood sugar levels dropping to 50-68 mg/dL, mostly in the early mornings and occasionally at night, with the last episode occurring four nights ago.  She struggles with dietary adherence, often starting with good intentions but ends up snacking or skipping meals, leading to poor eating choices later in the day. Initially, she did well after consulting a dietitian but has since reverted to old habits.  Regarding hypertension, she has not taken metoprolol  today due to not eating, although she did take valsartan  hydrochlorothiazide  this morning. Her blood pressure was elevated today, likely due to missing her metoprolol  dose.  For hyperlipidemia, she is on Lipitor but has not been taking it consistently. She plans to start taking it regularly after being informed of her high cholesterol levels.  She only takes medication for acid reflux as needed and reports no current issues with it. No nausea, vomiting, constipation, or diarrhea. She  has not scheduled a colonoscopy yet but has the contact information to do so.  . She is due for cervical cancer screening and has not had a physical exam in two years.  She reports occasional shortness of breath but no fast or irregular heartbeats, no chest pains.  No abnormal pains at this time   Review of Systems:  Review of Systems  Constitutional:  Negative for chills, fever and weight loss.  HENT:  Negative for tinnitus.   Respiratory:  Negative for cough, sputum production and shortness of breath.   Cardiovascular:  Negative for chest pain, palpitations and leg swelling.  Gastrointestinal:  Negative for abdominal pain, constipation, diarrhea and heartburn.  Genitourinary:  Negative for dysuria, frequency and urgency.  Musculoskeletal:  Negative for back pain, falls, joint pain and myalgias.  Skin: Negative.   Neurological:  Negative for dizziness and headaches.  Psychiatric/Behavioral:  Negative for depression and memory loss. The patient does not have insomnia.     Past Medical History:  Diagnosis Date   Anxiety    Diabetes mellitus without complication (HCC)    Hyperlipidemia    Hypertension    Mild acid reflux    Past Surgical History:  Procedure Laterality Date   CESAREAN SECTION  11/1990   Social History:   reports that she has never smoked. She has never used smokeless tobacco. She reports that she does not drink alcohol and does not use drugs.  Family History  Problem Relation Age of Onset   Heart attack Mother 89   Other Father 11       Drowned   Hyperlipidemia Sister  Hypertension Sister    Anxiety disorder Sister    Heart attack Brother    Other Brother 9       Pacemaker   Breast cancer Neg Hx     Medications: Patient's Medications  New Prescriptions   DULAGLUTIDE  (TRULICITY ) 3 MG/0.5ML SOAJ    Inject 3 mg as directed once a week.  Previous Medications   ASPIRIN EC 81 MG TABLET    Take 81 mg by mouth daily. Swallow whole.   ATORVASTATIN  (LIPITOR) 80 MG TABLET    Take 80 mg by mouth daily.   CONTINUOUS GLUCOSE SENSOR (DEXCOM G7 SENSOR) MISC    CHANGE EVERY 10 DAYS (DX: E11.69) 30 DAYS   CYCLOBENZAPRINE (FLEXERIL) 5 MG TABLET    Take 5 mg by mouth as needed for muscle spasms.   EMPAGLIFLOZIN-METFORMIN HCL (SYNJARDY) 12.01-999 MG TABS    Take 1 tablet by mouth in the morning and at bedtime.   ESOMEPRAZOLE (NEXIUM) 40 MG CAPSULE    Take 40 mg by mouth daily as needed.   IBUPROFEN (ADVIL,MOTRIN) 200 MG TABLET    Take 400 mg by mouth every 6 (six) hours as needed for fever or moderate pain.   INSULIN GLARGINE (LANTUS) 100 UNIT/ML INJECTION    Inject 6 Units into the skin at bedtime.   METOPROLOL  SUCCINATE (TOPROL -XL) 25 MG 24 HR TABLET    TAKE 1 TABLET BY MOUTH EVERY EVENING.TAKE WITH OR IMMEDIATELY FOLLOWING A MEAL   NITROGLYCERIN (NITROSTAT) 0.3 MG SL TABLET    Place under the tongue.   VALSARTAN -HYDROCHLOROTHIAZIDE  (DIOVAN -HCT) 160-25 MG TABLET    TAKE 1 TABLET BY MOUTH EVERY DAY IN THE MORNING  Modified Medications   No medications on file  Discontinued Medications   DULAGLUTIDE  (TRULICITY ) 1.5 MG/0.5ML SOPN    Inject 1.5 mg into the skin once a week.   ROSUVASTATIN  (CRESTOR ) 40 MG TABLET    TAKE 1 TABLET BY MOUTH EVERY DAY    Physical Exam:  Vitals:   02/02/24 1313 02/02/24 1320  BP: (!) 166/98 (!) 162/94  Pulse: 78   Resp: 20   Temp: 97.8 F (36.6 C)   SpO2: 98%   Weight: 241 lb 12.8 oz (109.7 kg)   Height: 5\' 5"  (1.651 m)    Body mass index is 40.24 kg/m. Wt Readings from Last 3 Encounters:  02/02/24 241 lb 12.8 oz (109.7 kg)  10/17/23 236 lb 12.8 oz (107.4 kg)  08/22/23 242 lb 12.8 oz (110.1 kg)    Physical Exam Constitutional:      General: She is not in acute distress.    Appearance: She is well-developed. She is not diaphoretic.  HENT:     Head: Normocephalic and atraumatic.     Mouth/Throat:     Pharynx: No oropharyngeal exudate.  Eyes:     Conjunctiva/sclera: Conjunctivae normal.     Pupils:  Pupils are equal, round, and reactive to light.  Cardiovascular:     Rate and Rhythm: Normal rate and regular rhythm.     Heart sounds: Normal heart sounds.  Pulmonary:     Effort: Pulmonary effort is normal.     Breath sounds: Normal breath sounds.  Abdominal:     General: Bowel sounds are normal.     Palpations: Abdomen is soft.  Musculoskeletal:     Cervical back: Normal range of motion and neck supple.     Right lower leg: No edema.     Left lower leg: No edema.  Skin:  General: Skin is warm and dry.  Neurological:     Mental Status: She is alert.  Psychiatric:        Mood and Affect: Mood normal.     Labs reviewed: Basic Metabolic Panel: Recent Labs    07/11/23 1414 10/14/23 0817 01/19/24 0923  NA 139 142 140  K 4.4 4.1 4.0  CL 101 106 106  CO2 28 28 26   GLUCOSE 239* 117* 133*  BUN 9 9 9   CREATININE 0.59 0.60 0.55  CALCIUM  9.5 9.0 9.0   Liver Function Tests: Recent Labs    07/11/23 1414 10/14/23 0817 01/19/24 0923  AST 21 14 15   ALT 23 13 15   BILITOT 0.3 0.3 0.4  PROT 7.6 7.1 6.9   No results for input(s): "LIPASE", "AMYLASE" in the last 8760 hours. No results for input(s): "AMMONIA" in the last 8760 hours. CBC: Recent Labs    07/11/23 1414 01/19/24 0923  WBC 8.4 8.4  NEUTROABS 5,401 4,855  HGB 12.4 11.6*  HCT 38.7 36.8  MCV 75.9* 73.6*  PLT 369 276   Lipid Panel: Recent Labs    07/11/23 1414 10/14/23 0817  CHOL 245* 151  HDL 52 47*  LDLCALC 166* 87  TRIG 134 79  CHOLHDL 4.7 3.2   TSH: No results for input(s): "TSH" in the last 8760 hours. A1C: Lab Results  Component Value Date   HGBA1C 7.4 (H) 01/19/2024     Assessment/Plan  Type 2 diabetes mellitus with other specified complication, with long-term current use of insulin (HCC) Assessment & Plan: Encouraged dietary compliance, routine foot care/monitoring and to keep up with diabetic eye exams through ophthalmology  Increase in Trulicity , will decrease Lantus to 6 units  to avoid hypoglycemia.   Orders: -     Trulicity ; Inject 3 mg as directed once a week.  Dispense: 3 mL; Refill: 1 -     Hemoglobin A1c; Future  Hyperlipidemia LDL goal <70 Assessment & Plan: Not controlled, not taking medication as prescribed, encouraged compliance.   Orders: -     Lipid panel; Future  Primary hypertension Assessment & Plan: Elevated today but possibly due to not taking medication this morning To check blood pressure at home/work and notify if readings remain elevated Continue current regimen  Orders: -     COMPLETE METABOLIC PANEL WITHOUT GFR; Future -     CBC with Differential/Platelet; Future  Morbid obesity (HCC) Assessment & Plan: -education provided on healthy weight loss through increase in physical activity and proper nutrition    Low back pain without sciatica, unspecified back pain laterality, unspecified chronicity Assessment & Plan: Well controlled at this time   Encounter for screening for HIV -     HIV Antibody (routine testing w rflx); Future  Need for hepatitis C screening test -     Hepatitis C antibody; Future     Return in about 3 months (around 05/04/2024) for routine follow up, labs prior to visit.:   [place] K. Denney Fisherman [facility] [phone]

## 2024-02-02 NOTE — Assessment & Plan Note (Signed)
-  education provided on healthy weight loss through increase in physical activity and proper nutrition

## 2024-02-02 NOTE — Assessment & Plan Note (Signed)
 Encouraged dietary compliance, routine foot care/monitoring and to keep up with diabetic eye exams through ophthalmology  Increase in Trulicity , will decrease Lantus to 6 units to avoid hypoglycemia.

## 2024-02-06 ENCOUNTER — Other Ambulatory Visit: Payer: Self-pay | Admitting: Nurse Practitioner

## 2024-02-06 DIAGNOSIS — Z1231 Encounter for screening mammogram for malignant neoplasm of breast: Secondary | ICD-10-CM

## 2024-02-09 ENCOUNTER — Ambulatory Visit
Admission: RE | Admit: 2024-02-09 | Discharge: 2024-02-09 | Discharge status: Home / Self Care | Source: Ambulatory Visit | Attending: Nurse Practitioner | Admitting: Nurse Practitioner

## 2024-02-09 DIAGNOSIS — Z1231 Encounter for screening mammogram for malignant neoplasm of breast: Secondary | ICD-10-CM

## 2024-03-15 ENCOUNTER — Encounter: Admitting: Nurse Practitioner

## 2024-04-21 ENCOUNTER — Encounter: Admitting: Nurse Practitioner

## 2024-05-07 ENCOUNTER — Other Ambulatory Visit

## 2024-05-10 ENCOUNTER — Ambulatory Visit: Admitting: Nurse Practitioner

## 2024-11-04 ENCOUNTER — Other Ambulatory Visit: Payer: Self-pay | Admitting: Nurse Practitioner
